# Patient Record
Sex: Male | Born: 1950 | Race: Black or African American | Hispanic: No | Marital: Married | Smoking: Current every day smoker
Health system: Southern US, Community
[De-identification: ages and names within clinical notes are randomized; demographics above are authoritative.]

## PROBLEM LIST (undated history)

## (undated) DIAGNOSIS — I10 Essential (primary) hypertension: Secondary | ICD-10-CM

## (undated) DIAGNOSIS — J449 Chronic obstructive pulmonary disease, unspecified: Secondary | ICD-10-CM

## (undated) HISTORY — DX: Chronic obstructive pulmonary disease, unspecified: J44.9

---

## 2011-09-21 ENCOUNTER — Encounter (HOSPITAL_COMMUNITY): Payer: Self-pay | Admitting: *Deleted

## 2011-09-21 ENCOUNTER — Emergency Department (HOSPITAL_COMMUNITY)
Admission: EM | Admit: 2011-09-21 | Discharge: 2011-09-21 | Disposition: A | Discharge status: Home / Self Care | Payer: Medicaid Other | Attending: Emergency Medicine | Admitting: Emergency Medicine

## 2011-09-21 DIAGNOSIS — H109 Unspecified conjunctivitis: Secondary | ICD-10-CM | POA: Insufficient documentation

## 2011-09-21 DIAGNOSIS — I10 Essential (primary) hypertension: Secondary | ICD-10-CM | POA: Insufficient documentation

## 2011-09-21 DIAGNOSIS — Z880 Allergy status to penicillin: Secondary | ICD-10-CM | POA: Insufficient documentation

## 2011-09-21 DIAGNOSIS — F17200 Nicotine dependence, unspecified, uncomplicated: Secondary | ICD-10-CM | POA: Insufficient documentation

## 2011-09-21 HISTORY — DX: Essential (primary) hypertension: I10

## 2011-09-21 MED ORDER — ERYTHROMYCIN 5 MG/GM OP OINT
TOPICAL_OINTMENT | Freq: Once | OPHTHALMIC | Status: DC
  Filled 2011-09-21: qty 3.5

## 2011-09-21 MED ORDER — TETRACAINE HCL 0.5 % OP SOLN
1.0000 [drp] | Freq: Once | OPHTHALMIC | Status: AC
  Administered 2011-09-21: 1 [drp] via OPHTHALMIC
  Filled 2011-09-21: qty 2

## 2011-09-21 MED ORDER — ERYTHROMYCIN 5 MG/GM OP OINT
TOPICAL_OINTMENT | Freq: Once | OPHTHALMIC | Status: AC
  Administered 2011-09-21: 11:00:00 via OPHTHALMIC

## 2011-09-21 MED ORDER — IBUPROFEN 800 MG PO TABS: 800.0000 mg | ORAL_TABLET | Freq: Three times a day (TID) | ORAL | Status: AC | PRN

## 2011-09-21 NOTE — ED Provider Notes (Signed)
History     CSN: [id number]  Arrival date & time 09/21/11  1023   First MD Initiated Contact with Patient 09/21/11 1031      Chief Complaint  Patient presents with  . Eye Pain    (Consider location/radiation/quality/duration/timing/severity/associated sxs/prior treatment) HPI Comments: Patient presents with a red right eye for the past 4 days. No acute injury or abrasions at onset. Patient denies nausea or vomiting. He denies change in vision. Patient states that he has had crusting in the eye in the morning. No sick contacts. History of vision problems. He does not wear contact lenses. He is taking tramadol without relief of pain. No history of autoimmune diseases. He does not have photophobia. Patient states that he has minimal pain in his eye and minimal pain with movement of his eye. Nothing makes symptoms better or worse. Onset was acute. Course is constant. Pain does not radiate.  Patient is a 61 y.o. male presenting with conjunctivitis. The history is provided by the patient.  Conjunctivitis  The current episode started 3 to 5 days ago. The onset was gradual. The problem has been unchanged. Associated symptoms include eye itching, eye discharge, eye pain and eye redness. Pertinent negatives include no fever, no decreased vision, no double vision, no photophobia, no nausea, no vomiting, no ear discharge, no ear pain, no headaches, no rhinorrhea and no sore throat. The eye pain is mild. There is pain in the right eye.    Past Medical History  Diagnosis Date  . Hypertension     History reviewed. No pertinent past surgical history.  No family history on file.  History  Substance Use Topics  . Smoking status: Current Everyday Smoker -- 1.0 packs/day  . Smokeless tobacco: Not on file  . Alcohol Use: Yes     ocassionally      Review of Systems  Constitutional: Negative for fever.  HENT: Negative for ear pain, sore throat, rhinorrhea and ear discharge.   Eyes: Positive for  pain, discharge, redness and itching. Negative for double vision, photophobia and visual disturbance.  Gastrointestinal: Negative for nausea and vomiting.  Neurological: Negative for headaches.    Allergies  Penicillins  Home Medications   Current Outpatient Rx  Name Route Sig Dispense Refill  . IBUPROFEN 800 MG PO TABS Oral Take 800 mg by mouth every 8 (eight) hours as needed. For pain.    Marland Kitchen LISINOPRIL-HYDROCHLOROTHIAZIDE 20-25 MG PO TABS Oral Take 1 tablet by mouth daily.    . TRAMADOL HCL 50 MG PO TABS Oral Take 50 mg by mouth every 8 (eight) hours as needed. For pain.      BP 150/113  Pulse 77  Temp 98.3 F (36.8 C) (Oral)  Resp 18  Wt 280 lb (127.007 kg)  SpO2 96%  Physical Exam  Nursing note and vitals reviewed. Constitutional: He appears well-developed and well-nourished.  HENT:  Head: Normocephalic and atraumatic.  Right Ear: External ear normal.  Left Ear: External ear normal.  Nose: Nose normal.  Mouth/Throat: Oropharynx is clear and moist.  Eyes: EOM are normal. Pupils are equal, round, and reactive to light. Right eye exhibits no discharge. Left eye exhibits no discharge. Right conjunctiva is injected. Right conjunctiva has no hemorrhage. Left conjunctiva is not injected. Left conjunctiva has no hemorrhage. Right eye exhibits normal extraocular motion. Left eye exhibits normal extraocular motion.       No pain with light shined in either eye. Pupils reactive. Right sclera and conjunctiva injected. Pressure, right: 30-35.  Cornea clear.   Neck: Normal range of motion. Neck supple.  Cardiovascular: Normal rate, regular rhythm and normal heart sounds.   Pulmonary/Chest: Effort normal and breath sounds normal.  Abdominal: Soft. There is no tenderness.  Neurological: He is alert.  Skin: Skin is warm and dry.  Psychiatric: He has a normal mood and affect.    ED Course  Procedures (including critical care time)  Labs Reviewed - No data to display No results  found.   1. Conjunctivitis     11:06 AM Patient seen and examined. Work-up initiated. Medications ordered. Visual acuity reviewed. Tonometry performed.   Vital signs reviewed and are as follows: Filed Vitals:   09/21/11 1037  BP: 150/113  Pulse: 77  Temp: 98.3 F (36.8 C)  Resp: 18    Patient counseled to use tobrex as follows: 0.5 inch ribbon in affected eye(s) 4 times a day.  Pt counseled on strategies to decrease transmission of infection. Pt counseled to see ophtho referral in 3 days to ensure resolution of symptoms, for IOC recheck given mild elevation in pressure.     MDM  No foreign bodies noted. No surrounding erythema, swelling, vision changes/loss suspicious for orbital or periorbital cellulitis. No signs of iritis. Do not suspect acute glaucoma given this presentation, however R eye pressures slightly elevated. Ophthalmology referral given for recheck.  No symptoms of retinal detachment. No ophthalmologic emergency suspected. Outpatient referral given for follow-up. Patient encouraged to follow-up.         Renne Crigler, [place] 09/21/11 1149

## 2011-09-21 NOTE — ED Notes (Signed)
Pt states "my eye started being like this on Monday, my eye hurt, my face and head around my eye"

## 2011-09-21 NOTE — ED Notes (Signed)
Pt presents with redness & edema to right eye.

## 2011-09-22 NOTE — ED Provider Notes (Signed)
Medical screening examination/treatment/procedure(s) were performed by non-physician practitioner and as supervising physician I was immediately available for consultation/collaboration.  Donnetta Hutching, MD 09/22/11 1112

## 2011-12-21 ENCOUNTER — Ambulatory Visit (INDEPENDENT_AMBULATORY_CARE_PROVIDER_SITE_OTHER): Payer: Medicaid Other | Admitting: General Surgery

## 2012-01-16 ENCOUNTER — Ambulatory Visit (INDEPENDENT_AMBULATORY_CARE_PROVIDER_SITE_OTHER): Payer: Medicaid Other | Admitting: General Surgery

## 2012-01-30 ENCOUNTER — Ambulatory Visit (INDEPENDENT_AMBULATORY_CARE_PROVIDER_SITE_OTHER): Payer: Medicaid Other | Admitting: General Surgery

## 2012-03-06 ENCOUNTER — Ambulatory Visit (INDEPENDENT_AMBULATORY_CARE_PROVIDER_SITE_OTHER): Payer: Medicaid Other | Admitting: General Surgery

## 2012-03-20 ENCOUNTER — Encounter (INDEPENDENT_AMBULATORY_CARE_PROVIDER_SITE_OTHER): Payer: Self-pay | Admitting: General Surgery

## 2012-03-20 ENCOUNTER — Ambulatory Visit (INDEPENDENT_AMBULATORY_CARE_PROVIDER_SITE_OTHER): Payer: Medicaid Other | Admitting: General Surgery

## 2012-03-20 VITALS — BP 138/88 | HR 80 | Temp 97.0°F | Resp 18 | Ht 71.0 in | Wt 307.0 lb

## 2012-03-20 DIAGNOSIS — L732 Hidradenitis suppurativa: Secondary | ICD-10-CM

## 2012-03-20 MED ORDER — SULFAMETHOXAZOLE-TRIMETHOPRIM 800-160 MG PO TABS: 1.0000 | ORAL_TABLET | Freq: Two times a day (BID) | ORAL | Status: AC

## 2012-03-20 NOTE — Patient Instructions (Signed)
Pick up antibiotic at your pharmacy  Hidradenitis Suppurativa, Sweat Gland Abscess Hidradenitis suppurativa is a long lasting (chronic), uncommon disease of the sweat glands. With this, boil-like lumps and scarring develop in the groin, some times under the arms (axillae), and under the breasts. It may also uncommonly occur behind the ears, in the crease of the buttocks, and around the genitals.  CAUSES  The cause is from a blocking of the sweat glands. They then become infected. It may cause drainage and odor. It is not contagious. So it cannot be given to someone else. It most often shows up in puberty (about 31 to 60 years of age). But it may happen much later. It is similar to acne which is a disease of the sweat glands. This condition is slightly more common in African-Americans and women. SYMPTOMS   Hidradenitis usually starts as one or more red, tender, swellings in the groin or under the arms (axilla).  Over a period of hours to days the lesions get larger. They often open to the skin surface, draining clear to yellow-colored fluid.  The infected area heals with scarring. DIAGNOSIS  Your caregiver makes this diagnosis by looking at you. Sometimes cultures (growing germs on plates in the lab) may be taken. This is to see what germ (bacterium) is causing the infection.  TREATMENT   Topical germ killing medicine applied to the skin (antibiotics) are the treatment of choice. Antibiotics taken by mouth (systemic) are sometimes needed when the condition is getting worse or is severe.  Avoid tight-fitting clothing which traps moisture in.  Dirt does not cause hidradenitis and it is not caused by poor hygiene.  Involved areas should be cleaned daily using an antibacterial soap. Some patients find that the liquid form of Lever 2000, applied to the involved areas as a lotion after bathing, can help reduce the odor related to this condition.  Sometimes surgery is needed to drain infected areas  or remove scarred tissue. Removal of large amounts of tissue is used only in severe cases.  Birth control pills may be helpful.  Oral retinoids (vitamin A derivatives) for 6 to 12 months which are effective for acne may also help this condition.  Weight loss will improve but not cure hidradenitis. It is made worse by being overweight. But the condition is not caused by being overweight.  This condition is more common in people who have had acne.  It may become worse under stress. There is no medical cure for hidradenitis. It can be controlled, but not cured. The condition usually continues for years with periods of getting worse and getting better (remission). Document Released: 09/27/2003 Document Revised: 05/07/2011 Document Reviewed: 10/13/2007 [facility] Patient Information 2013 [place], [place].

## 2012-03-20 NOTE — Progress Notes (Signed)
Patient ID: Ruben Anderson, male   DOB: 02-25-1951, 62 y.o.   MRN: [id number]  Chief Complaint  Patient presents with  . New Evaluation    cellulitis groin    HPI Ruben Anderson is a 62 y.o. male.   HPI 62 yo morbidly obese AAM referred by Ms Oran Rein, PA-C for evaluation of groin abscess. The patient was originally scheduled and referred back in October 2013 however the patient has canceled and rescheduled multiple times. The patient states that he has a boil in his pubic area. It will occasionally swell up and spontaneously drained. This happens every couple months. He also has an area in his inner thighs that does th same thing. He has never had these areas incised and drained. He has occasionally been placed on antibiotics for it. He states that he is trying to lose some weight. He denies any fevers or chills. Occasionally will drain some yellowish fluid. He denies any boils underneath this arm pits or underneath his pecs.  Past Medical History  Diagnosis Date  . Hypertension     No past surgical history on file.  Family History  Problem Relation Age of Onset  . Kidney disease Mother     Social History History  Substance Use Topics  . Smoking status: Current Every Day Smoker -- 1.0 packs/day  . Smokeless tobacco: Not on file  . Alcohol Use: Yes     Comment: ocassionally    Allergies  Allergen Reactions  . Penicillins Itching    Current Outpatient Prescriptions  Medication Sig Dispense Refill  . lisinopril-hydrochlorothiazide (PRINZIDE,ZESTORETIC) 20-25 MG per tablet Take 1 tablet by mouth daily.      . traMADol (ULTRAM) 50 MG tablet Take 50 mg by mouth every 8 (eight) hours as needed. For pain.      Marland Kitchen sulfamethoxazole-trimethoprim (BACTRIM DS) 800-160 MG per tablet Take 1 tablet by mouth 2 (two) times daily.  10 tablet  0    Review of Systems Review of Systems  Constitutional: Negative for fever, chills, appetite change and unexpected weight change.  HENT: Negative  for congestion and trouble swallowing.   Eyes: Negative for visual disturbance.  Respiratory: Negative for chest tightness and shortness of breath.   Cardiovascular: Negative for chest pain and leg swelling.       No PND, no orthopnea, + DOE  Gastrointestinal:       See HPI  Genitourinary: Negative for dysuria and hematuria.  Musculoskeletal: Negative.        OA pains  Skin: Negative for rash.  Neurological: Negative for seizures and speech difficulty.  Hematological: Does not bruise/bleed easily.  Psychiatric/Behavioral: Negative for behavioral problems and confusion.    Blood pressure 138/88, pulse 80, temperature 97 F (36.1 C), resp. rate 18, height 5\' 11"  (1.803 m), weight 307 lb (139.254 kg).  Physical Exam Physical Exam  Vitals reviewed. Constitutional: He is oriented to person, place, and time. He appears well-developed and well-nourished. No distress.       Morbid obesity  HENT:  Head: Normocephalic and atraumatic.  Right Ear: External ear normal.  Left Ear: External ear normal.  Eyes: Conjunctivae normal are normal. No scleral icterus.  Neck: No tracheal deviation present. No thyromegaly present.  Cardiovascular: Normal rate, regular rhythm and normal heart sounds.   Pulmonary/Chest: Effort normal and breath sounds normal. No respiratory distress. He has no wheezes.  Abdominal: Soft. He exhibits no distension. There is no tenderness.  Genitourinary: Penis normal.  Decent size pannus. In L suprapubic has pinpoint area draining yellowish fluid. About 1 cm of surrounding induration, no cellulitis, no fluctuance. Underneath pannus in base pannicular fold has a few pinpoint openings that will drain some thin yellow fluid, no cellulitis, no fluctuance, no induration.   Musculoskeletal: He exhibits no edema.  Lymphadenopathy:    He has no cervical adenopathy.  Neurological: He is alert and oriented to person, place, and time. He exhibits normal muscle tone.  Skin:  Skin is warm and dry. He is not diaphoretic.  Psychiatric: He has a normal mood and affect. His behavior is normal. Judgment and thought content normal.    Data Reviewed Ms Oran Rein, PA-C note from 11/2011  Assessment    Hidradenitis of pubis    Plan    He does have a little bit of induration around an area in his suprapubic area. However it is not significantly infected enough for me to recommend incision and drainage in the office today. I will place him on Bactrim for 5 days. We discussed hydradenitis. He was given Agricultural engineer. My recommendation was to treat it as he develops problems with it. We discussed surgical excision of the skin and subcutaneous tissues as a way for definitive treatment. We discussed the risk and benefits of that procedure. However given his morbid obesity and tobacco use I explained that he has a significant chance of wound healing problems. Since this is not really bother him that much I recommend waiting on surgery until he loses additional weight and stop smoking. Followup as needed  Mary Sella. Andrey Campanile, MD, FACS General, Bariatric, & Minimally Invasive Surgery [facility], [place]        [facility] M 03/20/2012, 11:07 AM

## 2015-04-15 ENCOUNTER — Emergency Department (HOSPITAL_COMMUNITY)
Admission: EM | Admit: 2015-04-15 | Discharge: 2015-04-15 | Disposition: A | Discharge status: Home / Self Care | Payer: Medicaid Other | Attending: Emergency Medicine | Admitting: Emergency Medicine

## 2015-04-15 ENCOUNTER — Encounter (HOSPITAL_COMMUNITY): Payer: Self-pay | Admitting: Family Medicine

## 2015-04-15 DIAGNOSIS — G510 Bell's palsy: Secondary | ICD-10-CM

## 2015-04-15 DIAGNOSIS — I10 Essential (primary) hypertension: Secondary | ICD-10-CM | POA: Diagnosis not present

## 2015-04-15 DIAGNOSIS — M25511 Pain in right shoulder: Secondary | ICD-10-CM | POA: Insufficient documentation

## 2015-04-15 DIAGNOSIS — Z880 Allergy status to penicillin: Secondary | ICD-10-CM | POA: Diagnosis not present

## 2015-04-15 DIAGNOSIS — H9191 Unspecified hearing loss, right ear: Secondary | ICD-10-CM | POA: Diagnosis not present

## 2015-04-15 DIAGNOSIS — R29810 Facial weakness: Secondary | ICD-10-CM | POA: Diagnosis present

## 2015-04-15 DIAGNOSIS — F17200 Nicotine dependence, unspecified, uncomplicated: Secondary | ICD-10-CM | POA: Diagnosis not present

## 2015-04-15 DIAGNOSIS — Z79899 Other long term (current) drug therapy: Secondary | ICD-10-CM | POA: Insufficient documentation

## 2015-04-15 MED ORDER — PREDNISONE 10 MG PO TABS: ORAL_TABLET | ORAL | Status: DC

## 2015-04-15 NOTE — Discharge Instructions (Signed)
Bell Palsy °Bell palsy is a condition in which the muscles on one side of the face become paralyzed. This often causes one side of the face to droop. It is a common condition and most people recover completely. °RISK FACTORS °Risk factors for Bell palsy include: °· Pregnancy. °· Diabetes. °· An infection by a virus, such as infections that cause cold sores. °CAUSES  °Bell palsy is caused by damage to or inflammation of a nerve in your face. It is unclear why this happens, but an infection by a virus may lead to it. Most of the time the reason it happens is unknown. °SIGNS AND SYMPTOMS  °Symptoms can range from mild to severe and can take place over a number of hours. Symptoms may include: °· Being unable to: °¨ Raise one or both eyebrows. °¨ Close one or both eyes. °¨ Feel parts of your face (facial numbness). °· Drooping of the eyelid and corner of the mouth. °· Weakness in the face. °· Paralysis of half your face. °· Loss of taste. °· Sensitivity to loud noises. °· Difficulty chewing. °· Tearing up of the affected eye. °· Dryness in the affected eye. °· Drooling. °· Pain behind one ear. °DIAGNOSIS  °Diagnosis of Bell palsy may include: °· A medical history and physical exam. °· An MRI. °· A CT scan. °· Electromyography (EMG). This is a test that checks how your nerves are working. °TREATMENT  °Treatment may include antiviral medicine to help shorten the length of the condition. Sometimes treatment is not needed and the symptoms go away on their own. °HOME CARE INSTRUCTIONS  °· Take medicines only as directed by your health care provider. °· Do facial massages and exercises as directed by your health care provider. °· If your eye is affected: °¨ Use moisturizing eye drops to prevent drying of your eye as directed by your health care provider. °¨ Protect your eye as directed by your health care provider. °SEEK MEDICAL CARE IF: °· Your symptoms do not get better or get worse. °· You are drooling. °· Your eye is red,  irritated, or hurts. °SEEK IMMEDIATE MEDICAL CARE IF:  °· Another part of your body feels weak or numb. °· You have difficulty swallowing. °· You have a fever along with symptoms of Bell palsy. °· You develop neck pain. °MAKE SURE YOU:  °· Understand these instructions. °· Will watch your condition. °· Will get help right away if you are not doing well or get worse. °  °This information is not intended to replace advice given to you by your health care provider. Make sure you discuss any questions you have with your health care provider. °  °Document Released: 02/12/2005 Document Revised: 11/03/2014 Document Reviewed: 05/22/2013 °Elsevier Interactive Patient Education ©2016 [employer]vier Inc[employer] ° °

## 2015-04-15 NOTE — ED Provider Notes (Signed)
CSN: [id number]     Arrival date & time 04/15/15  1607 History  By signing my name below, I, [facility], attest that this documentation has been prepared under the direction and in the presence of [employer], PA-C. Electronically Signed: Randell Patient, ED Scribe. 04/15/2015. 5:52 PM.   Chief Complaint  Patient presents with  . Facial Droop   The history is provided by the patient. No language interpreter was used.   HPI Comments: Ruben Anderson is a 65 y.o. male with an hx of HTN who presents to the Emergency Department complaining of constant, mild, unchanging right-sided facial droop upon waking this morning. Patient reports that other than congestion for the past few days he was healthy when going to sleep last night before waking this morning with the facial droop and tearing from his right eye. He endorses associated right-sided shoulder pain and this caused by sleeping with this arm propped up.Marland Kitchen He has taken Mucinex for treatment of his congestion.  Per patient, he has an appointment with his PCP 3 days from today for a chek-up. Current smoker but states that he plans to quit next week. Patient denies an hx of DM. He denies headache, dizziness, nausea, vomiting, memory deficits, weakness in his extremities, and paraesthesia.   Past Medical History  Diagnosis Date  . Hypertension    History reviewed. No pertinent past surgical history. Family History  Problem Relation Age of Onset  . Kidney disease Mother    Social History  Substance Use Topics  . Smoking status: Current Every Day Smoker -- 1.00 packs/day  . Smokeless tobacco: None  . Alcohol Use: Yes     Comment: ocassionally    Review of Systems A complete 10 system review of systems was obtained and all systems are negative except as noted in the HPI and PMH.    Allergies  Penicillins  Home Medications   Prior to Admission medications   Medication Sig Start Date End Date Taking? Authorizing Provider   lisinopril-hydrochlorothiazide (PRINZIDE,ZESTORETIC) 20-25 MG per tablet Take 1 tablet by mouth daily.    Historical Provider, MD  predniSONE (DELTASONE) 10 MG tablet Please take 6 tabs daily for 5 days. Day six 5 tabs, Day seven 4 tabs, Day eight 3 tabs, Day nine 2 tabs, Day ten 1 tab 04/15/15   Eyvonne Mechanic, PA-C  traMADol (ULTRAM) 50 MG tablet Take 50 mg by mouth every 8 (eight) hours as needed. For pain.    Historical Provider, MD   BP 121/82 mmHg  Pulse 93  Temp(Src) 98.4 F (36.9 C) (Oral)  Resp 18  SpO2 94%   Physical Exam  Constitutional: He is oriented to person, place, and time. He appears well-developed and well-nourished. No distress.  HENT:  Head: Normocephalic and atraumatic.  Right Ear: Tympanic membrane and ear canal normal. Decreased hearing is noted.  Left Ear: Hearing, external ear and ear canal normal.  Eyes: Conjunctivae and EOM are normal.  Neck: Neck supple. No tracheal deviation present.  Cardiovascular: Normal rate.   Pulmonary/Chest: Effort normal. No respiratory distress.  Musculoskeletal: Normal range of motion.  Neurological: He is alert and oriented to person, place, and time. A cranial nerve deficit is present.  Patient unable to raise right eyebrow and wrinkle forehead. Unable to completely close right eye and drooping of the right corner mouth. Remainder of strength exam completely normal, sensation grossly intact to upper and lower extremities  Skin: Skin is warm and dry.  Psychiatric: He has a normal  mood and affect. His behavior is normal.  Nursing note and vitals reviewed.   ED Course  Procedures   DIAGNOSTIC STUDIES: Oxygen Saturation is 94% on RA, adequate by my interpretation.    COORDINATION OF CARE: 5:29 PM Discussed treatment plan with pt at bedside and pt agreed to plan.  Labs Review Labs Reviewed - No data to display  Imaging Review No results found. I have personally reviewed and evaluated these images and lab results as part  of my medical decision-making.   EKG Interpretation None      MDM   Final diagnoses:  Bell's palsy    Labs:  Imaging:  Consults:  Therapeutics:  Discharge Meds: Steroids  Assessment/Plan: 65 year old male presents today with right-sided facial droop. His signs and symptoms are most consistent with Bell's palsy. Patient has focal neurological deficits of the right side of the face, he has no other neurological deficits on exam. Patient is well-appearing in no acute distress, he has no headache or dizziness. Patient reports that he is not diabetic, not on any blood thinners, he'll be started on steroids, he has a previous scheduled follow-up with his primary care provider on Monday for routine physical. He is instructed to make this appointment, he is encouraged to return immediately to the emergency room if any new or worsening signs or symptoms present. Patient verbalized understanding and agreement for today's plan had no further questions or concerns at time of discharge  I personally performed the services described in this documentation, which was scribed in my presence. The recorded information has been reviewed and is accurate.    Eyvonne Mechanic, PA-C 04/15/15 1752  Eyvonne Mechanic, PA-C 04/15/15 1752  Raeford Razor, MD 04/16/15 [id number]

## 2015-04-15 NOTE — ED Notes (Signed)
Pt states c/o facial numbness to his right side that he first noticed this morning when he woke up, facial droop noted

## 2015-04-15 NOTE — ED Notes (Signed)
Pt here for right sided facial droop. sts he woke up this way. No other deficits noted.  sts he has been sick with a cold and having shoulder and neck pain the past 3 days.

## 2017-10-17 ENCOUNTER — Other Ambulatory Visit: Payer: Self-pay | Admitting: Family Medicine

## 2017-10-17 ENCOUNTER — Ambulatory Visit
Admission: RE | Admit: 2017-10-17 | Discharge: 2017-10-17 | Disposition: A | Discharge status: Home / Self Care | Payer: Self-pay | Source: Ambulatory Visit | Attending: Family Medicine | Admitting: Family Medicine

## 2017-10-17 DIAGNOSIS — R053 Chronic cough: Secondary | ICD-10-CM

## 2017-10-17 DIAGNOSIS — F17200 Nicotine dependence, unspecified, uncomplicated: Secondary | ICD-10-CM

## 2017-10-17 DIAGNOSIS — R05 Cough: Secondary | ICD-10-CM

## 2018-07-16 ENCOUNTER — Other Ambulatory Visit: Payer: Self-pay | Admitting: Family Medicine

## 2018-07-16 DIAGNOSIS — R634 Abnormal weight loss: Secondary | ICD-10-CM

## 2018-07-16 DIAGNOSIS — R0602 Shortness of breath: Secondary | ICD-10-CM

## 2018-07-16 DIAGNOSIS — R5383 Other fatigue: Secondary | ICD-10-CM

## 2018-07-16 DIAGNOSIS — R1084 Generalized abdominal pain: Secondary | ICD-10-CM

## 2018-07-31 ENCOUNTER — Other Ambulatory Visit: Payer: Self-pay | Admitting: Family Medicine

## 2018-07-31 DIAGNOSIS — M25511 Pain in right shoulder: Secondary | ICD-10-CM

## 2018-08-05 ENCOUNTER — Other Ambulatory Visit: Payer: Self-pay

## 2018-08-05 ENCOUNTER — Ambulatory Visit
Admission: RE | Admit: 2018-08-05 | Discharge: 2018-08-05 | Disposition: A | Discharge status: Home / Self Care | Payer: Medicare HMO | Source: Ambulatory Visit | Attending: Family Medicine | Admitting: Family Medicine

## 2018-08-05 ENCOUNTER — Encounter: Payer: Self-pay | Admitting: Radiology

## 2018-08-05 DIAGNOSIS — M25511 Pain in right shoulder: Secondary | ICD-10-CM

## 2018-08-05 DIAGNOSIS — R634 Abnormal weight loss: Secondary | ICD-10-CM

## 2018-08-05 DIAGNOSIS — R1084 Generalized abdominal pain: Secondary | ICD-10-CM

## 2018-08-05 DIAGNOSIS — R0602 Shortness of breath: Secondary | ICD-10-CM

## 2018-08-05 DIAGNOSIS — R5383 Other fatigue: Secondary | ICD-10-CM

## 2018-08-05 MED ORDER — IOPAMIDOL (ISOVUE-300) INJECTION 61%
125.0000 mL | Freq: Once | INTRAVENOUS | Status: AC | PRN
  Administered 2018-08-05: 100 mL via INTRAVENOUS

## 2018-09-02 ENCOUNTER — Ambulatory Visit (AMBULATORY_SURGERY_CENTER): Payer: Medicare HMO | Admitting: *Deleted

## 2018-09-02 ENCOUNTER — Other Ambulatory Visit: Payer: Self-pay

## 2018-09-02 VITALS — Ht 71.5 in | Wt 170.0 lb

## 2018-09-02 DIAGNOSIS — Z1211 Encounter for screening for malignant neoplasm of colon: Secondary | ICD-10-CM

## 2018-09-02 MED ORDER — SUPREP BOWEL PREP KIT 17.5-3.13-1.6 GM/177ML PO SOLN: 1.0000 | Freq: Once | ORAL | 0 refills | Status: AC

## 2018-09-02 NOTE — Progress Notes (Signed)
Patient denies any allergies to egg or soy products. Patient denies complications with anesthesia/sedation.  Patient denies oxygen use at home and denies diet medications.   Pt verified name, DOB, address and insurance during PV today. Pt mailed instruction packet to included paper to complete and mail back to [facility] with addressed and stamped envelope, Emmi video, copy of consent form to read and not return, and instructions.  PV completed over the phone. Pt encouraged to call with questions or issues.   

## 2018-09-15 ENCOUNTER — Telehealth: Payer: Self-pay | Admitting: Gastroenterology

## 2018-09-15 NOTE — Telephone Encounter (Signed)

## 2018-09-16 ENCOUNTER — Encounter: Payer: Medicare HMO | Admitting: Gastroenterology

## 2018-09-16 ENCOUNTER — Telehealth: Payer: Self-pay | Admitting: Gastroenterology

## 2018-09-16 NOTE — Telephone Encounter (Signed)
Patient canceled procedure at 1:00pm. Spoke with patient he stated that he woke up with a strong fever (102) and do not feel well he will need to reschedule at a later time.

## 2018-11-12 ENCOUNTER — Encounter: Payer: Self-pay | Admitting: Gastroenterology

## 2018-11-13 ENCOUNTER — Institutional Professional Consult (permissible substitution): Payer: Medicare HMO | Admitting: Internal Medicine

## 2018-11-14 ENCOUNTER — Telehealth: Payer: Self-pay | Admitting: Internal Medicine

## 2018-11-14 NOTE — Telephone Encounter (Signed)
Attempted to call patient, no answer left a detailed message advising if he has any COVID symptoms not to come in the office.  In message asked patient to call Monday morning prior to appointment.

## 2018-11-17 ENCOUNTER — Institutional Professional Consult (permissible substitution): Payer: Medicare HMO | Admitting: Internal Medicine

## 2018-11-17 NOTE — Telephone Encounter (Signed)
Pt has not shown up for 11:00 consult with MW today (11:26 as I am documenting this).  lmtcb for pt, as this will need to be rescheduled.  Wcb.

## 2018-11-18 NOTE — Telephone Encounter (Signed)
Spoke with pt, he scheduled a televisit for Monday at 11:15 with Dr. Melvyn Novas. Nothing further is needed.

## 2018-11-18 NOTE — Telephone Encounter (Signed)
ATC pt, no answer. Left message for pt to call back.  

## 2018-11-24 ENCOUNTER — Ambulatory Visit: Payer: Medicare HMO | Admitting: Internal Medicine

## 2018-11-24 ENCOUNTER — Other Ambulatory Visit: Payer: Self-pay

## 2018-12-10 ENCOUNTER — Ambulatory Visit: Payer: Medicare HMO | Admitting: Internal Medicine

## 2018-12-22 ENCOUNTER — Encounter: Payer: Self-pay | Admitting: Internal Medicine

## 2018-12-22 ENCOUNTER — Ambulatory Visit (INDEPENDENT_AMBULATORY_CARE_PROVIDER_SITE_OTHER): Payer: Medicare HMO | Admitting: Internal Medicine

## 2018-12-22 ENCOUNTER — Other Ambulatory Visit: Payer: Self-pay

## 2018-12-22 ENCOUNTER — Ambulatory Visit (INDEPENDENT_AMBULATORY_CARE_PROVIDER_SITE_OTHER)
Admission: RE | Admit: 2018-12-22 | Discharge: 2018-12-22 | Disposition: A | Discharge status: Home / Self Care | Payer: Medicare HMO | Source: Ambulatory Visit | Attending: Internal Medicine | Admitting: Internal Medicine

## 2018-12-22 DIAGNOSIS — J449 Chronic obstructive pulmonary disease, unspecified: Secondary | ICD-10-CM | POA: Diagnosis not present

## 2018-12-22 DIAGNOSIS — I10 Essential (primary) hypertension: Secondary | ICD-10-CM | POA: Diagnosis not present

## 2018-12-22 DIAGNOSIS — F17210 Nicotine dependence, cigarettes, uncomplicated: Secondary | ICD-10-CM | POA: Diagnosis not present

## 2018-12-22 DIAGNOSIS — R9389 Abnormal findings on diagnostic imaging of other specified body structures: Secondary | ICD-10-CM

## 2018-12-22 MED ORDER — TELMISARTAN-HCTZ 80-25 MG PO TABS: 1.0000 | ORAL_TABLET | Freq: Every day | ORAL | 11 refills | Status: DC

## 2018-12-22 MED ORDER — BREO ELLIPTA 100-25 MCG/INH IN AEPB: 1.0000 | INHALATION_SPRAY | Freq: Every morning | RESPIRATORY_TRACT | 11 refills | Status: DC

## 2018-12-22 MED ORDER — BUDESONIDE-FORMOTEROL FUMARATE 160-4.5 MCG/ACT IN AERO: 2.0000 | INHALATION_SPRAY | Freq: Two times a day (BID) | RESPIRATORY_TRACT | 0 refills | Status: DC

## 2018-12-22 NOTE — Patient Instructions (Addendum)
The key is to stop smoking completely before smoking completely stops you!  Start BREO  887  One click each am  - take a deep drag or two to be sure you get it all in then rinse and gargle and spit  Stop zestoretic (linsinopril)  And start micardis  80-25 one daily  - call if any problem getting this substitution   Please remember to go to the  x-ray department  for your tests - we will call you with the results when they are available     Please schedule a follow up office visit in 4 weeks, sooner if needed  with all medications /inhalers/ solutions in hand so we can verify exactly what you are taking. This includes all medications from all doctors and over the counters

## 2018-12-22 NOTE — Progress Notes (Addendum)
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 Pulmonary Consult    Referred by Dr. Lin Landsman. Pt c/o SOB for the past 1-2 years. He gets SOB walking from room to room at home.   Dyspnea:  60 f- 75 ft and stops Cough: slt hoarse /dry daytime cough  Sleep: pillows x one or two s resp symptoms SABA use: none  ? Some better with symbicort but not hfa near 0% effective even with me triggering device for him    No obvious day to day or daytime variability or assoc excess/ purulent sputum or mucus plugs or hemoptysis or cp or chest tightness, subjective wheeze or overt sinus or hb symptoms.   Sleeping without nocturnal  or early am exacerbation  of respiratory  c/o's or need for noct saba. Also denies any obvious fluctuation of symptoms with weather or environmental changes or other aggravating or alleviating factors except as outlined above   No unusual exposure hx or h/o childhood pna  or knowledge of premature birth.  Current Allergies, Complete Past Medical History, Past Surgical History, Family History, and Social History were reviewed in [facility] record.  ROS  The following are not active complaints unless bolded Hoarseness, sore throat, dysphagia, dental problems, itching, sneezing,  nasal congestion or discharge of excess mucus or purulent secretions, ear ache,   fever, chills, sweats, unintended wt loss or wt gain, classically pleuritic or exertional cp,   orthopnea pnd or arm/hand swelling  or leg swelling, presyncope, palpitations, abdominal pain, anorexia, nausea, vomiting, diarrhea  or change in bowel habits or change in bladder habits, change in stools or change in urine, dysuria, hematuria,  rash, arthralgias, visual complaints, headache, numbness, weakness or ataxia or problems with walking or coordination,  change in mood or  memory.          Past Medical History:  Diagnosis Date  . COPD (chronic obstructive pulmonary disease) ([place])   . Hypertension     Outpatient Medications Prior to Visit  Medication Sig Dispense Refill  . lisinopril-hydrochlorothiazide (PRINZIDE,ZESTORETIC) 20-25 MG per tablet Take 1 tablet by mouth daily.    Marland Kitchen oxyCODONE-acetaminophen (PERCOCET) 10-325 MG tablet     . SYMBICORT 160-4.5 MCG/ACT inhaler Inhale 2 puffs into the lungs 2 (two) times daily.     . traMADol (ULTRAM) 50 MG tablet Take 50 mg by mouth every 8 (eight) hours as needed. For pain.    Marland Kitchen SUPREP BOWEL PREP KIT 17.5-3.13-1.6 GM/177ML SOLN         Objective:     BP 130/82 (BP Location: Left Arm, Cuff Size: Normal)   Temp (!) 97.2 F (36.2 C) (Temporal)   Ht '5\' 11"'  (1.803 m)   Wt 155 lb (70.3 kg)   SpO2 91% Comment: on RA  BMI 21.62 kg/m   SpO2: 91 %(on RA)   W/c bound >>> stated age chronically appearing thin bm can't  more either arm at shoulder  HEENT : pt wearing mask not removed for exam due to covid -19 concerns.    NECK :  without JVD/Nodes/TM/ nl carotid upstrokes bilaterally   LUNGS: no acc muscle use,  Mod barrel  contour chest wall with bilateral  Distant bs s audible wheeze and  without cough on insp or exp maneuvers and mod  Hyperresonant  to  percussion bilaterally     CV:  RRR  no s3 or murmur or increase in P2, and no edema   ABD:  soft and nontender with pos mid insp Hoover's  in the supine position. No bruits or organomegaly appreciated, bowel sounds nl  MS:     ext warm with severe degen deformities both hands ,  calf tenderness, cyanosis or clubbing  - bilateral frozen shoulders to has to bend over at waist to use inhalers     SKIN: warm and dry without lesions    NEURO:  ? Slowed mentation  with  no motor or cerebellar deficits apparent.          I personally reviewed images and agree with radiology impression as follows:   Chest CT  08/05/2018 1.  Severe, bullous emphysema.  2. There are dense, bandlike consolidations or scarring of the lateral segment right middle lobe and right lung base, likely sequelae of prior infection or aspiration. There is no evidence of suspicious pulmonary nodule, mass, or other evidence of lung malignancy.  3. Severe prostatomegaly with multiple large low-attenuation internal nodules, the prostate measuring at least 7.7 cm in diameter. There is no evidence of pelvic or abdominal lymphadenopathy, or CT findings suspicious for osseous metastatic disease.  4.  Atherosclerosis.   CXR PA and Lateral:   12/22/2018 :    I personally reviewed images and agree with radiology impression as follows:    Emphysema and further collapse of the right middle lobe. Endobronchial debris and/or obstructing endobronchial lesion cannot be excluded and correlation with either a repeat contrast chest CT or bronchoscopy may be considered.         Assessment   COPD GOLD ?  active smoker Active smoker -  12/22/2018  After extensive coaching inhaler device,  effectiveness =   75% with dpi p loaded  But 0 with hfa even with assistance > try Breo 1 click each am   DDX of  difficult airways management almost all start with A and  include Adherence, Ace Inhibitors, Acid Reflux, Active Sinus Disease, Alpha 1 Antitripsin deficiency, Anxiety masquerading as Airways dz,  ABPA,  Allergy(esp in young), Aspiration (esp in elderly), Adverse effects of meds,  Active smoking or vaping, A bunch of PE's (a small clot burden can't cause this syndrome unless there is already severe underlying  pulm or vascular dz with poor reserve) plus two Bs  = Bronchiectasis and Beta blocker use..and one C= CHF  Adherence is always the initial "prime suspect" and is a multilayered concern that requires a "trust but verify" approach in every patient - starting with knowing how to use medications, especially inhalers, correctly, keeping up with refills and understanding the fundamental difference between maintenance and prns vs those medications only taken for a very short course and then stopped and not refilled.  - see device teaching - return with all meds in hand using a trust but verify approach to confirm accurate Medication  Reconciliation The principal here is that until we are certain that the  patients are doing what we've asked,  it makes no sense to ask them to do more.   Active smoking > see sep a/p  ACEi adverse effects also  at the  top of the usual list of suspects and the only way to rule it out is a trial off > see a/p    ?allergy/ AB >  rx just with laba/ics for now given severity of protate enlargement would avoid LAMA   ? Anxiety/depression/ cognitive decline with deconditioning > usually at the bottom of this list of usual suspects but   may interfere with adherence and also interpretation of response or lack thereof to symptom management which can be quite subjective.   ? 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  Discussed in detail all the  indications, usual  risks and alternatives  relative to the benefits with patient who agrees to proceed with w/u as outlined.       Essential hypertension Dc acei 12/22/2018   In the best review of chronic  cough to date ( NEJM 2016 375 2595-6387) ,  ACEi are now felt to cause cough in up to  20% of pts which is a 4 fold increase from previous reports and does not include the variety of non-specific complaints we see in pulmonary clinic in pts on ACEi but previously attributed to another dx like  Copd/asthma and  include PNDS, throat and chest congestion, "bronchitis", unexplained dyspnea and noct "strangling" sensations, and hoarseness, but also  atypical /refractory GERD symptoms like dysphagia and "bad heartburn"   The only way I know  to prove this is not an "ACEi Case" is a trial off ACEi x a minimum of 4 weeks then regroup.   Try micardis 80-25 instead   Cigarette smoker 4-5 min discussion re active cigarette smoking in addition to office E&M  Ask about tobacco use:   ongoing Advise quitting   I emphasized that although we never turn away smokers from the pulmonary clinic, we do ask that they understand that the recommendations that we make  won't work nearly as well in the presence of continued cigarette exposure. In fact, we may very well  reach a point where we can't promise to help the patient if he/she can't quit smoking. (We can and will promise to try to help, we just can't promise what we recommend will really work)  Assess willingness:  Not committed at this point Assist in quit attempt:  Per PCP when ready Arrange follow up:   Follow up per Primary Care planned           Total time devoted to counseling  > 50 % of initial 60 min office visit:  reviewed case with pt/by speaker phone with son Ronalee Belts,  performed device teaching  using a teach back technique which also  extended face to face time for this visit (see above) discussion of options/alternatives/ personally creating written customized instructions  in presence of pt  then going over those specific  Instructions directly with the pt including how to use all of the meds but in particular covering each new medication  in detail and  the difference between the maintenance= "automatic" meds and the prns using an action plan format for the latter (If this problem/symptom => do that organization reading Left to right).  Please see AVS from this visit for a full list of these instructions which I personally wrote for this pt and  are unique to this visit.      Christinia Gully, MD 12/22/2018

## 2018-12-23 ENCOUNTER — Encounter: Payer: Self-pay | Admitting: Internal Medicine

## 2018-12-23 ENCOUNTER — Encounter: Payer: Medicare HMO | Admitting: Gastroenterology

## 2018-12-23 ENCOUNTER — Telehealth: Payer: Self-pay | Admitting: Internal Medicine

## 2018-12-23 DIAGNOSIS — F17210 Nicotine dependence, cigarettes, uncomplicated: Secondary | ICD-10-CM | POA: Insufficient documentation

## 2018-12-23 DIAGNOSIS — R9389 Abnormal findings on diagnostic imaging of other specified body structures: Secondary | ICD-10-CM | POA: Insufficient documentation

## 2018-12-23 DIAGNOSIS — R918 Other nonspecific abnormal finding of lung field: Secondary | ICD-10-CM

## 2018-12-23 NOTE — Assessment & Plan Note (Signed)
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  Discussed in detail all the  indications, usual  risks and alternatives  relative to the benefits with patient who agrees to proceed with w/u as outlined.

## 2018-12-23 NOTE — Assessment & Plan Note (Signed)
Dc acei 12/22/2018   In the best review of chronic cough to date ( NEJM 2016 375 8144-8185) ,  ACEi are now felt to cause cough in up to  20% of pts which is a 4 fold increase from previous reports and does not include the variety of non-specific complaints we see in pulmonary clinic in pts on ACEi but previously attributed to another dx like  Copd/asthma and  include PNDS, throat and chest congestion, "bronchitis", unexplained dyspnea and noct "strangling" sensations, and hoarseness, but also  atypical /refractory GERD symptoms like dysphagia and "bad heartburn"   The only way I know  to prove this is not an "ACEi Case" is a trial off ACEi x a minimum of 4 weeks then regroup.   Try micardis 80-25 instead   Total time devoted to counseling  > 50 % of initial 60 min office visit:  reviewed case with pt/by speaker phone with son Ronalee Belts,  performed device teaching  using a teach back technique which also  extended face to face time for this visit (see above) discussion of options/alternatives/ personally creating written customized instructions  in presence of pt  then going over those specific  Instructions directly with the pt including how to use all of the meds but in particular covering each new medication in detail and the difference between the maintenance= "automatic" meds and the prns using an action plan format for the latter (If this problem/symptom => do that organization reading Left to right).  Please see AVS from this visit for a full list of these instructions which I personally wrote for this pt and  are unique to this visit.

## 2018-12-23 NOTE — Assessment & Plan Note (Signed)

## 2018-12-23 NOTE — Progress Notes (Signed)
LMTCB

## 2018-12-23 NOTE — Assessment & Plan Note (Signed)
Active smoker -  12/22/2018  After extensive coaching inhaler device,  effectiveness =   75% with dpi p loaded  But 0 with hfa even with assistance > try Breo 1 click each am   DDX of  difficult airways management almost all start with A and  include Adherence, Ace Inhibitors, Acid Reflux, Active Sinus Disease, Alpha 1 Antitripsin deficiency, Anxiety masquerading as Airways dz,  ABPA,  Allergy(esp in young), Aspiration (esp in elderly), Adverse effects of meds,  Active smoking or vaping, A bunch of PE's (a small clot burden can't cause this syndrome unless there is already severe underlying pulm or vascular dz with poor reserve) plus two Bs  = Bronchiectasis and Beta blocker use..and one C= CHF  Adherence is always the initial "prime suspect" and is a multilayered concern that requires a "trust but verify" approach in every patient - starting with knowing how to use medications, especially inhalers, correctly, keeping up with refills and understanding the fundamental difference between maintenance and prns vs those medications only taken for a very short course and then stopped and not refilled.  - see device teaching - return with all meds in hand using a trust but verify approach to confirm accurate Medication  Reconciliation The principal here is that until we are certain that the  patients are doing what we've asked, it makes no sense to ask them to do more.   Active smoking > see sep a/p  ACEi adverse effects also  at the  top of the usual list of suspects and the only way to rule it out is a trial off > see a/p    ?allergy/ AB >  rx just with laba/ics for now given severity of protate enlargement would avoid LAMA   ? Anxiety/depression/ cognitive decline with deconditioning > usually at the bottom of this list of usual suspects but   may interfere with adherence and also interpretation of response or lack thereof to symptom management which can be quite subjective.   ? Alpha one AT > extremely  rare in AA  >>> will keep things simple for now and just make two changes for now then see back in 4 weeks

## 2018-12-23 NOTE — Telephone Encounter (Signed)
Called the patient and advised of the result information below. Patient asked if it will be done within 4 weeks. He is already scheduled for a follow up with Dr. Melvyn Novas on 01/21/19.  Chest CT ordered with notation to be done prior to already scheduled appointment with Dr. Melvyn Novas.  Nothing further needed at this time.

## 2018-12-23 NOTE — Telephone Encounter (Signed)
Pt returning call.  [phone].

## 2018-12-23 NOTE — Telephone Encounter (Signed)
Attempted to call patient, no answer, left message to call back.  It appears that results were attempted to be called earlier today.  See below results.  "Notes recorded by Tanda Rockers, MD on 12/22/2018 at 5:09 PM EDT  Call pt: Reviewed cxr and there is collapse of one his smaller lobes on the right so rec CT c contrast No rush"

## 2018-12-25 ENCOUNTER — Telehealth: Payer: Self-pay

## 2018-12-25 NOTE — Telephone Encounter (Signed)
Started a PA on Telmisartan 80-25mg . Will await response from insurance.   [place]: [id number] - PA Case ID: [id number] Need help? Call us at [phone] Status Sent to [place] 80-25MG  tablets Form [employer] Electronic PA For

## 2018-12-26 ENCOUNTER — Telehealth: Payer: Self-pay

## 2018-12-26 NOTE — Telephone Encounter (Signed)
Patient No Showed for PV today. Patient was called to reschedule. No answer. A message was left on voicemail to call and reschedule before 5:00 Pm today. Patient was informed that if he did not reschedule before 5:00 his procedure will be cancelled. A no show letter will be mailed at the end of the day.   Riki Sheer, LPN ( PV )

## 2018-12-30 MED ORDER — OLMESARTAN MEDOXOMIL-HCTZ 20-12.5 MG PO TABS: 1.0000 | ORAL_TABLET | Freq: Every day | ORAL | 5 refills | Status: AC

## 2018-12-30 NOTE — Telephone Encounter (Signed)
Received fax from [facility]. PA for telmisartan-hctz was denied. Patient must try and fail two of the following medications: lisinopril, ramipril, benazepril, benazepril-hctz, quinapril, quinapril-hctz, enalapril, enalapril-hctz, losartan, losartan-hctz, valsartan, valsartan-hctz, irbesartan, irbesartan-hctz, olmesartan or olmesartan-hctz.   Reviewed patient's chart. It appears that he was on lisinopril-hctz before.   MW, please advise if you want [place] to appeal [employer]'s decision.

## 2018-12-30 NOTE — Telephone Encounter (Signed)
Prescription for Olmesartan 20/12.5 sent to pharmacy per MW. Patient notified.

## 2018-12-30 NOTE — Telephone Encounter (Signed)
I called and spoke with the patient and made him aware that insurance did not want to cover the Telmisartan and that Dr. Melvyn Novas will change him to a similar medication that is covered called Olmesartan. He verbalized understanding.  Dr. Melvyn Novas please advise in strength as I could not fine the 20/25, did you mean 20/12.5?

## 2018-12-30 NOTE — Addendum Note (Signed)
Addended by: Hildred Alamin I on: 12/30/2018 03:31 PM   Modules accepted: Orders

## 2018-12-30 NOTE — Telephone Encounter (Signed)
Yes ok to do the 20-12.5

## 2018-12-30 NOTE — Addendum Note (Signed)
Addended by: Hildred Alamin I on: 12/30/2018 03:18 PM   Modules accepted: Orders

## 2018-12-30 NOTE — Telephone Encounter (Signed)
olmesartan 20-25 one daily is the roughly the same rx as th 80-25 telmisartan

## 2019-01-07 ENCOUNTER — Encounter: Payer: Self-pay | Admitting: Gastroenterology

## 2019-01-09 ENCOUNTER — Encounter: Payer: Medicare HMO | Admitting: Gastroenterology

## 2019-01-12 ENCOUNTER — Telehealth: Payer: Self-pay | Admitting: Internal Medicine

## 2019-01-12 ENCOUNTER — Ambulatory Visit
Admission: RE | Admit: 2019-01-12 | Discharge: 2019-01-12 | Disposition: A | Discharge status: Home / Self Care | Payer: Medicare HMO | Source: Ambulatory Visit | Attending: Internal Medicine | Admitting: Internal Medicine

## 2019-01-12 ENCOUNTER — Other Ambulatory Visit: Payer: Self-pay

## 2019-01-12 ENCOUNTER — Other Ambulatory Visit: Payer: Self-pay | Admitting: Internal Medicine

## 2019-01-12 DIAGNOSIS — R918 Other nonspecific abnormal finding of lung field: Secondary | ICD-10-CM

## 2019-01-12 DIAGNOSIS — R9389 Abnormal findings on diagnostic imaging of other specified body structures: Secondary | ICD-10-CM

## 2019-01-12 NOTE — Telephone Encounter (Signed)
Routing to Dr. Melvyn Novas to complete documentation of telephone encounter.

## 2019-01-12 NOTE — Telephone Encounter (Signed)
They have attempted 4 times to get a IV in the patient arm. Unsuccessful. Wanting recommendations regarding if we still want with contrast or try again another day.   Call transferred to Dr. Melvyn Novas per his request.

## 2019-01-12 NOTE — Telephone Encounter (Signed)
Advised to proceed with non-contrasted study

## 2019-01-13 NOTE — Progress Notes (Signed)
Spoke with pt and notified of results per Dr. Wert. Pt verbalized understanding and denied any questions. 

## 2019-01-21 ENCOUNTER — Encounter: Payer: Self-pay | Admitting: Internal Medicine

## 2019-01-21 ENCOUNTER — Ambulatory Visit (INDEPENDENT_AMBULATORY_CARE_PROVIDER_SITE_OTHER): Payer: Medicare HMO | Admitting: Internal Medicine

## 2019-01-21 DIAGNOSIS — J449 Chronic obstructive pulmonary disease, unspecified: Secondary | ICD-10-CM | POA: Diagnosis not present

## 2019-01-21 DIAGNOSIS — I10 Essential (primary) hypertension: Secondary | ICD-10-CM | POA: Diagnosis not present

## 2019-01-21 DIAGNOSIS — R9389 Abnormal findings on diagnostic imaging of other specified body structures: Secondary | ICD-10-CM | POA: Diagnosis not present

## 2019-01-21 DIAGNOSIS — F17210 Nicotine dependence, cigarettes, uncomplicated: Secondary | ICD-10-CM | POA: Diagnosis not present

## 2019-01-21 NOTE — Progress Notes (Addendum)
Ruben CashMichael D Anderson, male    DOB: Jul 24, 1950,   MRN: [id number]   Brief patient profile:  [age] yobm active smoker from [place] asthma as child resolved by age 68-14 and able to work [employer] mostly doing delivery work and [place] to [place] with onset of doe around 2016 much worse x 2019  so referred to pulmonary clinic 12/22/2018 by Dr  Ruben Anderson     History of Present Illness  12/22/2018  Pulmonary/ 1st office eval/Wert maint on symbicort but unable to trigger  Chief Complaint  Patient presents with  . Pulmonary Consult    Referred by Dr. Leilani AbleBetti Anderson. Pt c/o SOB for the past 1-2 years. He gets SOB walking from room to room at home.   Dyspnea:  60 f- 75 ft and stops Cough: slt hoarse /dry daytime cough  Sleep: pillows x one or two s resp symptoms SABA use: none  ? Some better with symbicort but not hfa near 0% effective even with me triggering device for him  rec The key is to stop smoking completely before smoking completely stops you! Start BREO  100  One click each am  - take a deep drag or two to be sure you get it all in then rinse and gargle and spit Stop zestoretic (linsinopril)  And start micardis  80-25 one daily  - call if any problem getting this substitution Please remember to go to the  x-ray department  for your tests - we will call you with the results when they are available   Please schedule a follow up office visit in 4 weeks, sooner if needed  with all medications /inhalers/ solutions in hand so we can verify exactly what you are taking. This includes all medications from all doctors and over the counters    Virtual Visit via Telephone Note 01/21/2019   I connected with Ruben Anderson on 01/21/19 at  9:00 AM EST by telephone and verified that I am speaking with the correct person using two identifiers.   I discussed the limitations, risks, security and privacy concerns of performing an evaluation and management service  by telephone and the availability of in person appointments. I also discussed with the patient that there may be a patient responsible charge related to this service. The patient expressed understanding and agreed to proceed.   History of Present Illness: re ? COPD/ still smoking/ stopped breo sev days prior to televisit Copd ? Not much better on breo on vs off  Dyspnea:  Maybe a block which is better and no change on breo vs off Cough: much better off acei Sleeping: fine flat  SABA use: none  02: none    No obvious day to day or daytime variability or assoc excess/ purulent sputum or mucus plugs or hemoptysis or cp or chest tightness, subjective wheeze or overt sinus or hb symptoms.    Also denies any obvious fluctuation of symptoms with weather or environmental changes or other aggravating or alleviating factors except as outlined above.   Meds reviewed/ med reconciliation completed      Observations/Objective: Very upbeat, no cough or wob noted on interview   Assessment and Plan: See problem list for active a/p's   Follow Up Instructions: See avs for instructions unique to this ov which includes revised/ updated med list     I discussed the assessment and treatment plan with the patient. The patient was provided an opportunity  to ask questions and all were answered. The patient agreed with the plan and demonstrated an understanding of the instructions.   The patient was advised to call back or seek an in-person evaluation if the symptoms worsen or if the condition fails to improve as anticipated.  I provided 25  minutes of non-face-to-face time during this encounter.   Ruben Hughs, MD                                Assessment   COPD GOLD ?  active smoker Active smoker -  12/22/2018  After extensive coaching inhaler device,  effectiveness =   75% with dpi p loaded  But 0 with hfa even with assistance > try Breo 1 click each am   DDX of  difficult  airways management almost all start with A and  include Adherence, Ace Inhibitors, Acid Reflux, Active Sinus Disease, Alpha 1 Antitripsin deficiency, Anxiety masquerading as Airways dz,  ABPA,  Allergy(esp in young), Aspiration (esp in elderly), Adverse effects of meds,  Active smoking or vaping, A bunch of PE's (a small clot burden can't cause this syndrome unless there is already severe underlying pulm or vascular dz with poor reserve) plus two Bs  = Bronchiectasis and Beta blocker use..and one C= CHF  Adherence is always the initial "prime suspect" and is a multilayered concern that requires a "trust but verify" approach in every patient - starting with knowing how to use medications, especially inhalers, correctly, keeping up with refills and understanding the fundamental difference between maintenance and prns vs those medications only taken for a very short course and then stopped and not refilled.  - see device teaching - return with all meds in hand using a trust but verify approach to confirm accurate Medication  Reconciliation The principal here is that until we are certain that the  patients are doing what we've asked, it makes no sense to ask them to do more.   Active smoking > see sep a/p  ACEi adverse effects also  at the  top of the usual list of suspects and the only way to rule it out is a trial off > see a/p    ?allergy/ AB >  rx just with laba/ics for now given severity of protate enlargement would avoid LAMA   ? Anxiety/depression/ cognitive decline with deconditioning > usually at the bottom of this list of usual suspects but   may interfere with adherence and also interpretation of response or lack thereof to symptom management which can be quite subjective.   ? 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  Discussed in detail all the  indications, usual  risks and alternatives  relative to the benefits with patient who agrees to proceed with w/u as outlined.       Essential hypertension Dc acei 12/22/2018   In the best review of chronic cough to date ( NEJM 2016 375 1544-1551) ,  ACEi are now felt to cause cough in up to  20% of  pts which is a 4 fold increase from previous reports and does not include the variety of non-specific complaints we see in pulmonary clinic in pts on ACEi but previously attributed to another dx like  Copd/asthma and  include PNDS, throat and chest congestion, "bronchitis", unexplained dyspnea and noct "strangling" sensations, and hoarseness, but also  atypical /refractory GERD symptoms like dysphagia and "bad heartburn"   The only way I know  to prove this is not an "ACEi Case" is a trial off ACEi x a minimum of 4 weeks then regroup.   Try micardis 80-25 instead

## 2019-01-21 NOTE — Patient Instructions (Addendum)
The key is to stop smoking completely before smoking completely stops you!  No change in medications   Blood pressure will need to be monitored/ adjusted by your PCP (Dr Lin Landsman)  Please schedule a follow up visit in 6 months but call sooner if needed  with all medications /inhalers/ solutions in hand so we can verify exactly what you are taking. This includes all medications from all doctors and over the counters >>> chest cxray on return  >>> try trelegy if flares off breo but for now leave it off as he's not convinced it helped

## 2019-01-21 NOTE — Assessment & Plan Note (Signed)
Active smoker -  12/22/2018  After extensive coaching inhaler device,  effectiveness =   75% with dpi p loaded  But 0 with hfa even with assistance > try Breo 1 click each am  - 02/18/8249 reports able to walk to [place] (one block) much better off acei and no change on vs off breo so ok to leave off   If flares or worsens off Breo will add back Trelegy o/w no need for rx at this point

## 2019-01-21 NOTE — Assessment & Plan Note (Signed)
Dc acei 12/22/2018  Due to cough ? Pseudoasthma > better 01/21/2019 off all copd/asthma meds  Although even in retrospect it may not be clear the ACEi contributed to the pt's symptoms,  Pt improved off them and adding them back at this point or in the future would risk confusion in interpretation of non-specific respiratory symptoms to which this patient is prone  ie  Better not to muddy the waters here.   >>> f/u per PCP    Each maintenance medication was reviewed in detail including most importantly the difference between maintenance and as needed and under what circumstances the prns are to be used.  Please see AVS for specific  Instructions which are unique to this visit and I personally typed out  which were reviewed in detail over the phone with the patient and a copy provided via mail as does not have MyChart

## 2019-01-21 NOTE — Assessment & Plan Note (Signed)
See CT chest 08/05/18  - more of a mass like appearance on PA view 12/22/2018 > rec repeat CT with contrast  - CT chest 01/12/2019  1. Comparing to prior CT chest of 08/05/2018, the right middle and lower lobe chronic collapse/scarring is unchanged. 2. Small polypoid focus along a right upper lobe bronchus is new since prior and probably reflects mucus or secretions. Attention on follow-up recommended. 3. Expansile sclerotic and lucent lesion in the posterior left fourth rib is similar. 5 month interval stability is reassuring. Repeat CT chest in 6 months could be used to ensure continued stability. Nuclear medicine bone scan may prove helpful to assess for active bony turnover in this lesion. 4.  Emphysema. (ICD10-J43.9) 5.  Aortic Atherosclerois (ICD10-170.0)   Discussed in detail all the  indications, usual  risks and alternatives  relative to the benefits with patient who agrees to proceed with conservative  f/u in 6 m with cxr then decide whether repeat ct needed

## 2019-01-21 NOTE — Assessment & Plan Note (Signed)
Counseled re importance of smoking cessation but did not meet time criteria for separate billing   °

## 2019-01-27 ENCOUNTER — Telehealth: Payer: Self-pay

## 2019-01-27 NOTE — Telephone Encounter (Signed)
Patient No Showed for PV. Patient was called to reschedule. No answer. A message was left on voicemail to call and reschedule before 5:00 Pm today. A no show letter was mailed. Procedure was cancelled per [facility] guidelines.  Riki Sheer, LPN P ( PV )

## 2019-02-10 ENCOUNTER — Encounter: Payer: Medicare HMO | Admitting: Gastroenterology

## 2019-02-15 ENCOUNTER — Emergency Department (HOSPITAL_COMMUNITY): Payer: Medicare HMO

## 2019-02-15 ENCOUNTER — Inpatient Hospital Stay (HOSPITAL_COMMUNITY)
Admission: EM | Admit: 2019-02-15 | Discharge: 2019-02-27 | DRG: 207 | Disposition: E | Payer: Medicare HMO | Attending: Pulmonary Disease | Admitting: Pulmonary Disease

## 2019-02-15 DIAGNOSIS — Z20828 Contact with and (suspected) exposure to other viral communicable diseases: Secondary | ICD-10-CM | POA: Diagnosis present

## 2019-02-15 DIAGNOSIS — G9349 Other encephalopathy: Secondary | ICD-10-CM | POA: Diagnosis present

## 2019-02-15 DIAGNOSIS — J9602 Acute respiratory failure with hypercapnia: Secondary | ICD-10-CM

## 2019-02-15 DIAGNOSIS — J9622 Acute and chronic respiratory failure with hypercapnia: Secondary | ICD-10-CM | POA: Diagnosis present

## 2019-02-15 DIAGNOSIS — G931 Anoxic brain damage, not elsewhere classified: Secondary | ICD-10-CM | POA: Diagnosis present

## 2019-02-15 DIAGNOSIS — Z66 Do not resuscitate: Secondary | ICD-10-CM | POA: Diagnosis not present

## 2019-02-15 DIAGNOSIS — R404 Transient alteration of awareness: Secondary | ICD-10-CM | POA: Diagnosis present

## 2019-02-15 DIAGNOSIS — Z9911 Dependence on respirator [ventilator] status: Secondary | ICD-10-CM | POA: Diagnosis not present

## 2019-02-15 DIAGNOSIS — R911 Solitary pulmonary nodule: Secondary | ICD-10-CM | POA: Diagnosis present

## 2019-02-15 DIAGNOSIS — I110 Hypertensive heart disease with heart failure: Secondary | ICD-10-CM | POA: Diagnosis present

## 2019-02-15 DIAGNOSIS — I4510 Unspecified right bundle-branch block: Secondary | ICD-10-CM | POA: Diagnosis present

## 2019-02-15 DIAGNOSIS — E872 Acidosis: Secondary | ICD-10-CM | POA: Diagnosis present

## 2019-02-15 DIAGNOSIS — R34 Anuria and oliguria: Secondary | ICD-10-CM | POA: Diagnosis not present

## 2019-02-15 DIAGNOSIS — J9811 Atelectasis: Secondary | ICD-10-CM | POA: Diagnosis present

## 2019-02-15 DIAGNOSIS — N179 Acute kidney failure, unspecified: Secondary | ICD-10-CM | POA: Diagnosis not present

## 2019-02-15 DIAGNOSIS — J439 Emphysema, unspecified: Secondary | ICD-10-CM | POA: Diagnosis present

## 2019-02-15 DIAGNOSIS — R451 Restlessness and agitation: Secondary | ICD-10-CM | POA: Diagnosis not present

## 2019-02-15 DIAGNOSIS — M8588 Other specified disorders of bone density and structure, other site: Secondary | ICD-10-CM | POA: Diagnosis present

## 2019-02-15 DIAGNOSIS — R000 Tachycardia, unspecified: Secondary | ICD-10-CM | POA: Diagnosis present

## 2019-02-15 DIAGNOSIS — D72829 Elevated white blood cell count, unspecified: Secondary | ICD-10-CM | POA: Diagnosis not present

## 2019-02-15 DIAGNOSIS — J9601 Acute respiratory failure with hypoxia: Secondary | ICD-10-CM

## 2019-02-15 DIAGNOSIS — I469 Cardiac arrest, cause unspecified: Secondary | ICD-10-CM | POA: Diagnosis present

## 2019-02-15 DIAGNOSIS — R4182 Altered mental status, unspecified: Secondary | ICD-10-CM

## 2019-02-15 DIAGNOSIS — Z9981 Dependence on supplemental oxygen: Secondary | ICD-10-CM

## 2019-02-15 DIAGNOSIS — Z515 Encounter for palliative care: Secondary | ICD-10-CM | POA: Diagnosis not present

## 2019-02-15 DIAGNOSIS — R339 Retention of urine, unspecified: Secondary | ICD-10-CM | POA: Diagnosis not present

## 2019-02-15 DIAGNOSIS — F17210 Nicotine dependence, cigarettes, uncomplicated: Secondary | ICD-10-CM | POA: Diagnosis present

## 2019-02-15 DIAGNOSIS — J9621 Acute and chronic respiratory failure with hypoxia: Secondary | ICD-10-CM | POA: Diagnosis present

## 2019-02-15 DIAGNOSIS — J9690 Respiratory failure, unspecified, unspecified whether with hypoxia or hypercapnia: Secondary | ICD-10-CM

## 2019-02-15 DIAGNOSIS — I422 Other hypertrophic cardiomyopathy: Secondary | ICD-10-CM | POA: Diagnosis present

## 2019-02-15 DIAGNOSIS — I340 Nonrheumatic mitral (valve) insufficiency: Secondary | ICD-10-CM | POA: Diagnosis not present

## 2019-02-15 DIAGNOSIS — I5043 Acute on chronic combined systolic (congestive) and diastolic (congestive) heart failure: Secondary | ICD-10-CM | POA: Diagnosis present

## 2019-02-15 DIAGNOSIS — Z6825 Body mass index (BMI) 25.0-25.9, adult: Secondary | ICD-10-CM

## 2019-02-15 DIAGNOSIS — E876 Hypokalemia: Secondary | ICD-10-CM | POA: Diagnosis present

## 2019-02-15 DIAGNOSIS — R570 Cardiogenic shock: Secondary | ICD-10-CM | POA: Diagnosis present

## 2019-02-15 DIAGNOSIS — R569 Unspecified convulsions: Secondary | ICD-10-CM | POA: Diagnosis present

## 2019-02-15 DIAGNOSIS — R64 Cachexia: Secondary | ICD-10-CM | POA: Diagnosis not present

## 2019-02-15 LAB — POCT I-STAT 7, (LYTES, BLD GAS, ICA,H+H)
Acid-Base Excess: 2 mmol/L (ref 0.0–2.0)
Acid-Base Excess: 6 mmol/L — ABNORMAL HIGH (ref 0.0–2.0)
Bicarbonate: 34 mmol/L — ABNORMAL HIGH (ref 20.0–28.0)
Bicarbonate: 31.8 mmol/L — ABNORMAL HIGH (ref 20.0–28.0)
Calcium, Ion: 1.15 mmol/L (ref 1.15–1.40)
Calcium, Ion: 1.13 mmol/L — ABNORMAL LOW (ref 1.15–1.40)
HCT: 49 % (ref 39.0–52.0)
HCT: 45 % (ref 39.0–52.0)
Hemoglobin: 16.7 g/dL (ref 13.0–17.0)
Hemoglobin: 15.3 g/dL (ref 13.0–17.0)
O2 Saturation: 100 %
O2 Saturation: 94 %
Patient temperature: 98.6
Patient temperature: 98.6
Potassium: 3.1 mmol/L — ABNORMAL LOW (ref 3.5–5.1)
Potassium: 3.5 mmol/L (ref 3.5–5.1)
Sodium: 142 mmol/L (ref 135–145)
Sodium: 140 mmol/L (ref 135–145)
TCO2: 37 mmol/L — ABNORMAL HIGH (ref 22–32)
TCO2: 33 mmol/L — ABNORMAL HIGH (ref 22–32)
pCO2 arterial: 87.9 mmHg (ref 32.0–48.0)
pCO2 arterial: 46.9 mmHg (ref 32.0–48.0)
pH, Arterial: 7.195 — CL (ref 7.350–7.450)
pH, Arterial: 7.439 (ref 7.350–7.450)
pO2, Arterial: 384 mmHg — ABNORMAL HIGH (ref 83.0–108.0)
pO2, Arterial: 71 mmHg — ABNORMAL LOW (ref 83.0–108.0)

## 2019-02-15 LAB — CBC WITH DIFFERENTIAL/PLATELET
Abs Immature Granulocytes: 0.2 10*3/uL — ABNORMAL HIGH (ref 0.00–0.07)
Basophils Absolute: 0 10*3/uL (ref 0.0–0.1)
Basophils Relative: 0 %
Eosinophils Absolute: 0 10*3/uL (ref 0.0–0.5)
Eosinophils Relative: 0 %
HCT: 43.9 % (ref 39.0–52.0)
Hemoglobin: 13.6 g/dL (ref 13.0–17.0)
Immature Granulocytes: 2 %
Lymphocytes Relative: 6 %
Lymphs Abs: 0.8 10*3/uL (ref 0.7–4.0)
MCH: 27.9 pg (ref 26.0–34.0)
MCHC: 31 g/dL (ref 30.0–36.0)
MCV: 90 fL (ref 80.0–100.0)
Monocytes Absolute: 0.2 10*3/uL (ref 0.1–1.0)
Monocytes Relative: 1 %
Neutro Abs: 10.9 10*3/uL — ABNORMAL HIGH (ref 1.7–7.7)
Neutrophils Relative %: 91 %
Platelets: 212 10*3/uL (ref 150–400)
RBC: 4.88 MIL/uL (ref 4.22–5.81)
RDW: 13 % (ref 11.5–15.5)
WBC: 12.1 10*3/uL — ABNORMAL HIGH (ref 4.0–10.5)
nRBC: 0.2 % (ref 0.0–0.2)

## 2019-02-15 LAB — POCT I-STAT, CHEM 8
BUN: 26 mg/dL — ABNORMAL HIGH (ref 8–23)
Calcium, Ion: 1.07 mmol/L — ABNORMAL LOW (ref 1.15–1.40)
Chloride: 96 mmol/L — ABNORMAL LOW (ref 98–111)
Creatinine, Ser: 1 mg/dL (ref 0.61–1.24)
Glucose, Bld: 136 mg/dL — ABNORMAL HIGH (ref 70–99)
HCT: 48 % (ref 39.0–52.0)
Hemoglobin: 16.3 g/dL (ref 13.0–17.0)
Potassium: 3.6 mmol/L (ref 3.5–5.1)
Sodium: 143 mmol/L (ref 135–145)
TCO2: 35 mmol/L — ABNORMAL HIGH (ref 22–32)

## 2019-02-15 LAB — LACTIC ACID, PLASMA
Lactic Acid, Venous: 5.5 mmol/L (ref 0.5–1.9)
Lactic Acid, Venous: 2.8 mmol/L (ref 0.5–1.9)

## 2019-02-15 LAB — COMPREHENSIVE METABOLIC PANEL
ALT: 294 U/L — ABNORMAL HIGH (ref 0–44)
AST: 269 U/L — ABNORMAL HIGH (ref 15–41)
Albumin: 2.6 g/dL — ABNORMAL LOW (ref 3.5–5.0)
Alkaline Phosphatase: 50 U/L (ref 38–126)
Anion gap: 17 — ABNORMAL HIGH (ref 5–15)
BUN: 17 mg/dL (ref 8–23)
CO2: 27 mmol/L (ref 22–32)
Calcium: 8.2 mg/dL — ABNORMAL LOW (ref 8.9–10.3)
Chloride: 101 mmol/L (ref 98–111)
Creatinine, Ser: 0.89 mg/dL (ref 0.61–1.24)
GFR calc Af Amer: >60 mL/min (ref >60)
GFR calc non Af Amer: >60 mL/min (ref >60)
Glucose, Bld: 160 mg/dL — ABNORMAL HIGH (ref 70–99)
Potassium: 3.2 mmol/L — ABNORMAL LOW (ref 3.5–5.1)
Sodium: 145 mmol/L (ref 135–145)
Total Bilirubin: 1 mg/dL (ref 0.3–1.2)
Total Protein: 5.6 g/dL — ABNORMAL LOW (ref 6.5–8.1)

## 2019-02-15 LAB — RAPID URINE DRUG SCREEN, HOSP PERFORMED
Amphetamines: NOT DETECTED
Barbiturates: NOT DETECTED
Benzodiazepines: NOT DETECTED
Cocaine: NOT DETECTED
Opiates: NOT DETECTED
Tetrahydrocannabinol: NOT DETECTED

## 2019-02-15 LAB — BASIC METABOLIC PANEL
Anion gap: 9 (ref 5–15)
BUN: 23 mg/dL (ref 8–23)
CO2: 33 mmol/L — ABNORMAL HIGH (ref 22–32)
Calcium: 8.3 mg/dL — ABNORMAL LOW (ref 8.9–10.3)
Chloride: 101 mmol/L (ref 98–111)
Creatinine, Ser: 1.12 mg/dL (ref 0.61–1.24)
GFR calc Af Amer: >60 mL/min (ref >60)
GFR calc non Af Amer: >60 mL/min (ref >60)
Glucose, Bld: 142 mg/dL — ABNORMAL HIGH (ref 70–99)
Potassium: 3.6 mmol/L (ref 3.5–5.1)
Sodium: 143 mmol/L (ref 135–145)

## 2019-02-15 LAB — TROPONIN I (HIGH SENSITIVITY)
Troponin I (High Sensitivity): 996 ng/L (ref <18)
Troponin I (High Sensitivity): 858 ng/L (ref <18)

## 2019-02-15 LAB — GLUCOSE, CAPILLARY
Glucose-Capillary: 116 mg/dL — ABNORMAL HIGH (ref 70–99)
Glucose-Capillary: 106 mg/dL — ABNORMAL HIGH (ref 70–99)

## 2019-02-15 LAB — URINALYSIS, COMPLETE (UACMP) WITH MICROSCOPIC
Bacteria, UA: NONE SEEN
Bilirubin Urine: NEGATIVE
Glucose, UA: 50 mg/dL — AB
Ketones, ur: NEGATIVE mg/dL
Nitrite: NEGATIVE
Protein, ur: 100 mg/dL — AB
Specific Gravity, Urine: 1.024 (ref 1.005–1.030)
WBC, UA: >50 WBC/hpf — ABNORMAL HIGH (ref 0–5)
pH: 5 (ref 5.0–8.0)

## 2019-02-15 LAB — HEMOGLOBIN A1C
Hgb A1c MFr Bld: 6.1 % — ABNORMAL HIGH (ref 4.8–5.6)
Mean Plasma Glucose: 128.37 mg/dL

## 2019-02-15 LAB — RESPIRATORY PANEL BY RT PCR (FLU A&B, COVID)
Influenza A by PCR: NEGATIVE
Influenza B by PCR: NEGATIVE
SARS Coronavirus 2 by RT PCR: NEGATIVE

## 2019-02-15 LAB — ETHANOL: Alcohol, Ethyl (B): <10 mg/dL (ref <10)

## 2019-02-15 LAB — PROTIME-INR
INR: 1.3 — ABNORMAL HIGH (ref 0.8–1.2)
Prothrombin Time: 15.7 seconds — ABNORMAL HIGH (ref 11.4–15.2)

## 2019-02-15 LAB — PROCALCITONIN: Procalcitonin: <0.1 ng/mL

## 2019-02-15 LAB — MRSA PCR SCREENING: MRSA by PCR: NEGATIVE

## 2019-02-15 LAB — CBG MONITORING, ED: Glucose-Capillary: 101 mg/dL — ABNORMAL HIGH (ref 70–99)

## 2019-02-15 MED ORDER — SODIUM CHLORIDE 0.9 % IV SOLN
INTRAVENOUS | Status: DC
  Administered 2019-02-18: 11:00:00 900 mL via INTRAVENOUS

## 2019-02-15 MED ORDER — PROPOFOL 1000 MG/100ML IV EMUL
5.0000 ug/kg/min | INTRAVENOUS | Status: DC
  Administered 2019-02-15: 23:00:00 10 ug/kg/min via INTRAVENOUS
  Administered 2019-02-16: 22:00:00 15.068 ug/kg/min via INTRAVENOUS
  Administered 2019-02-16: 15 ug/kg/min via INTRAVENOUS
  Filled 2019-02-15 (×3): qty 100

## 2019-02-15 MED ORDER — ROCURONIUM BROMIDE 50 MG/5ML IV SOLN
INTRAVENOUS | Status: AC | PRN
  Administered 2019-02-15: 100 mg via INTRAVENOUS

## 2019-02-15 MED ORDER — SODIUM CHLORIDE 0.9 % IV SOLN
2.0000 g | INTRAVENOUS | Status: DC
  Administered 2019-02-15 – 2019-02-16 (×2): 2 g via INTRAVENOUS
  Filled 2019-02-15: qty 20
  Filled 2019-02-15: qty 2

## 2019-02-15 MED ORDER — ARFORMOTEROL TARTRATE 15 MCG/2ML IN NEBU
15.0000 ug | INHALATION_SOLUTION | Freq: Two times a day (BID) | RESPIRATORY_TRACT | Status: DC
  Administered 2019-02-15 – 2019-02-22 (×14): 15 ug via RESPIRATORY_TRACT
  Filled 2019-02-15 (×14): qty 2

## 2019-02-15 MED ORDER — PROPOFOL 1000 MG/100ML IV EMUL
INTRAVENOUS | Status: AC
  Filled 2019-02-15: qty 100

## 2019-02-15 MED ORDER — MIDAZOLAM BOLUS VIA INFUSION
1.0000 mg | INTRAVENOUS | Status: DC | PRN
  Filled 2019-02-15: qty 1

## 2019-02-15 MED ORDER — BUDESONIDE 0.5 MG/2ML IN SUSP
0.5000 mg | Freq: Two times a day (BID) | RESPIRATORY_TRACT | Status: DC
  Administered 2019-02-15 – 2019-02-22 (×14): 0.5 mg via RESPIRATORY_TRACT
  Filled 2019-02-15 (×14): qty 2

## 2019-02-15 MED ORDER — NOREPINEPHRINE 4 MG/250ML-% IV SOLN
0.0000 ug/min | INTRAVENOUS | Status: DC
  Administered 2019-02-15: 18:00:00 5 ug/min via INTRAVENOUS
  Administered 2019-02-15: 16:00:00 30.133 ug/min via INTRAVENOUS
  Filled 2019-02-15 (×2): qty 250

## 2019-02-15 MED ORDER — ROCURONIUM BROMIDE 50 MG/5ML IV SOLN
100.0000 mg | Freq: Once | INTRAVENOUS | Status: DC
  Filled 2019-02-15: qty 10

## 2019-02-15 MED ORDER — SODIUM CHLORIDE 0.9 % IV SOLN
INTRAVENOUS | Status: DC | PRN
  Administered 2019-02-15: 17:00:00 500 mL via INTRAVENOUS

## 2019-02-15 MED ORDER — INSULIN ASPART 100 UNIT/ML ~~LOC~~ SOLN
0.0000 [IU] | SUBCUTANEOUS | Status: DC
  Administered 2019-02-19 – 2019-02-20 (×3): 1 [IU] via SUBCUTANEOUS

## 2019-02-15 MED ORDER — CALCIUM GLUCONATE-NACL 1-0.675 GM/50ML-% IV SOLN
1.0000 g | Freq: Once | INTRAVENOUS | Status: AC
  Administered 2019-02-15: 17:00:00 1000 mg via INTRAVENOUS
  Filled 2019-02-15: qty 50

## 2019-02-15 MED ORDER — ETOMIDATE 2 MG/ML IV SOLN
INTRAVENOUS | Status: AC | PRN
  Administered 2019-02-15: 20 mg via INTRAVENOUS

## 2019-02-15 MED ORDER — MIDAZOLAM HCL 2 MG/2ML IJ SOLN
1.0000 mg | INTRAMUSCULAR | Status: DC | PRN
  Administered 2019-02-15: 1 mg via INTRAVENOUS

## 2019-02-15 MED ORDER — CHLORHEXIDINE GLUCONATE CLOTH 2 % EX PADS
6.0000 | MEDICATED_PAD | Freq: Every day | CUTANEOUS | Status: DC
  Administered 2019-02-15 – 2019-02-21 (×7): 6 via TOPICAL

## 2019-02-15 MED ORDER — ORAL CARE MOUTH RINSE
15.0000 mL | OROMUCOSAL | Status: DC
  Administered 2019-02-15 – 2019-02-22 (×65): 15 mL via OROMUCOSAL

## 2019-02-15 MED ORDER — FENTANYL CITRATE (PF) 100 MCG/2ML IJ SOLN
50.0000 ug | INTRAMUSCULAR | Status: DC | PRN
  Administered 2019-02-15 – 2019-02-22 (×11): 50 ug via INTRAVENOUS
  Filled 2019-02-15 (×10): qty 2

## 2019-02-15 MED ORDER — IPRATROPIUM-ALBUTEROL 0.5-2.5 (3) MG/3ML IN SOLN: 3.0000 mL | Freq: Four times a day (QID) | RESPIRATORY_TRACT | Status: DC | PRN

## 2019-02-15 MED ORDER — CHLORHEXIDINE GLUCONATE 0.12% ORAL RINSE (MEDLINE KIT)
15.0000 mL | Freq: Two times a day (BID) | OROMUCOSAL | Status: DC
  Administered 2019-02-15 – 2019-02-22 (×14): 15 mL via OROMUCOSAL

## 2019-02-15 MED ORDER — NOREPINEPHRINE 4 MG/250ML-% IV SOLN
0.0000 ug/min | INTRAVENOUS | Status: DC
  Administered 2019-02-15: 12:00:00 10 ug/min via INTRAVENOUS
  Filled 2019-02-15: qty 250

## 2019-02-15 MED ORDER — FENTANYL CITRATE (PF) 100 MCG/2ML IJ SOLN: 50.0000 ug | Freq: Once | INTRAMUSCULAR | Status: DC

## 2019-02-15 MED ORDER — MIDAZOLAM 50MG/50ML (1MG/ML) PREMIX INFUSION: 2.0000 mg/h | INTRAVENOUS | Status: DC

## 2019-02-15 MED ORDER — FENTANYL BOLUS VIA INFUSION: 25.0000 ug | INTRAVENOUS | Status: DC | PRN

## 2019-02-15 MED ORDER — FENTANYL 2500MCG IN NS 250ML (10MCG/ML) PREMIX INFUSION
0.0000 ug/h | INTRAVENOUS | Status: DC
  Administered 2019-02-15: 17:00:00 100 ug/h via INTRAVENOUS
  Administered 2019-02-16 (×2): 150 ug/h via INTRAVENOUS
  Administered 2019-02-17: 22:00:00 100 ug/h via INTRAVENOUS
  Filled 2019-02-15 (×4): qty 250

## 2019-02-15 MED ORDER — SODIUM CHLORIDE 0.9 % IV SOLN: INTRAVENOUS | Status: DC

## 2019-02-15 MED ORDER — FENTANYL CITRATE (PF) 100 MCG/2ML IJ SOLN
50.0000 ug | INTRAMUSCULAR | Status: AC | PRN
  Administered 2019-02-21 – 2019-02-22 (×3): 50 ug via INTRAVENOUS
  Filled 2019-02-15 (×3): qty 2

## 2019-02-15 MED ORDER — ASPIRIN 300 MG RE SUPP
300.0000 mg | RECTAL | Status: AC
  Administered 2019-02-15: 300 mg via RECTAL
  Filled 2019-02-15: qty 1

## 2019-02-15 MED ORDER — HEPARIN SODIUM (PORCINE) 5000 UNIT/ML IJ SOLN
5000.0000 [IU] | Freq: Three times a day (TID) | INTRAMUSCULAR | Status: DC
  Administered 2019-02-15 – 2019-02-22 (×20): 5000 [IU] via SUBCUTANEOUS
  Filled 2019-02-15 (×20): qty 1

## 2019-02-15 MED ORDER — PANTOPRAZOLE SODIUM 40 MG IV SOLR
40.0000 mg | Freq: Every day | INTRAVENOUS | Status: DC
  Administered 2019-02-15 – 2019-02-17 (×3): 40 mg via INTRAVENOUS
  Filled 2019-02-15 (×3): qty 40

## 2019-02-15 MED ORDER — SODIUM CHLORIDE 0.9% FLUSH
10.0000 mL | Freq: Two times a day (BID) | INTRAVENOUS | Status: DC
  Administered 2019-02-15 – 2019-02-20 (×7): 10 mL

## 2019-02-15 MED ORDER — POTASSIUM CHLORIDE 10 MEQ/50ML IV SOLN
10.0000 meq | INTRAVENOUS | Status: AC
  Administered 2019-02-15 (×2): 10 meq via INTRAVENOUS
  Filled 2019-02-15 (×3): qty 50

## 2019-02-15 MED ORDER — CALCIUM GLUCONATE-NACL 1-0.675 GM/50ML-% IV SOLN
1.0000 g | Freq: Once | INTRAVENOUS | Status: AC
  Administered 2019-02-15: 20:00:00 1000 mg via INTRAVENOUS
  Filled 2019-02-15: qty 50

## 2019-02-15 MED ORDER — MIDAZOLAM HCL 2 MG/2ML IJ SOLN: 1.0000 mg | Freq: Once | INTRAMUSCULAR | Status: DC

## 2019-02-15 MED ORDER — SODIUM CHLORIDE 0.9 % IV BOLUS
1000.0000 mL | Freq: Once | INTRAVENOUS | Status: AC
  Administered 2019-02-15: 11:00:00 1000 mL via INTRAVENOUS

## 2019-02-15 MED ORDER — MIDAZOLAM HCL 2 MG/2ML IJ SOLN
1.0000 mg | INTRAMUSCULAR | Status: DC | PRN
  Administered 2019-02-15: 1 mg via INTRAVENOUS
  Filled 2019-02-15 (×2): qty 2

## 2019-02-15 MED ORDER — SODIUM CHLORIDE 0.9% FLUSH: 10.0000 mL | INTRAVENOUS | Status: DC | PRN

## 2019-02-15 MED ORDER — LORAZEPAM 2 MG/ML IJ SOLN: 2.0000 mg | INTRAMUSCULAR | Status: DC | PRN

## 2019-02-15 MED ORDER — ETOMIDATE 2 MG/ML IV SOLN
20.0000 mg | Freq: Once | INTRAVENOUS | Status: DC
  Filled 2019-02-15: qty 10

## 2019-02-15 NOTE — Progress Notes (Signed)
CRITICAL VALUE ALERT  Critical Value:  Troponin 996  Date & Time Notied:  8251  Provider Notified: Francine Graven CCM  Orders Received/Actions taken: None

## 2019-02-15 NOTE — ED Notes (Signed)
Attempted report 

## 2019-02-15 NOTE — Progress Notes (Signed)
Patient came in intubated via king airway MD exchanged with a 7.5 ETT taped at 24 at lip, good color change on ETCO2 detector, equal BBS, SATS 99%, placed on above vent settings per ARDS net protocol, will continue to monitor patient.

## 2019-02-15 NOTE — Progress Notes (Signed)
[facility] Progress Note Patient Name: Ruben Anderson DOB: [birth year] MRN: [id number]   Date of Service  01/31/2019  HPI/Events of Note  Notified of seizure like activity. Currently sedated on Fentanyl 250. Target temp reached at 19:50  eICU Interventions  Ordered to start propofol for sedation and seizure control Also ordered ativan prn for seizure     Intervention Category Major Interventions: Seizures - evaluation and management  Judd Lien 02/21/2019, 11:03 PM

## 2019-02-15 NOTE — ED Triage Notes (Signed)
Pt here post arrest after calling family and asking them to call [phone] lsn 10 pm pt was found pulseless and apneic PEA on monitor , pt received 10 mins of cpr , 2 epi's and regained pulses , pt arrived with king airway

## 2019-02-15 NOTE — Progress Notes (Signed)
This chaplain responded to [employer] in [place].  The chaplain understands no Pt. needs at this time.  The chaplain was informed by RN contact with the Pt. family has occurred.  The chaplain is available for F/U spiritual care as needed.

## 2019-02-15 NOTE — ED Provider Notes (Addendum)
[facility] EMERGENCY DEPARTMENT Provider Note   CSN: [id number] Arrival date & time: 02/16/2019  1004     History No chief complaint on file.   Ruben GreenhouseMichael Anderson is a 68 y.o. male.  HPI    Presents in extremis, via EMS. Patient is unresponsive, level 5 caveat. Reportedly the patient was last in the" normal" state of affairs yesterday about 12 hours ago, but today called family members stated that he needed an ambulance.  On EMS arrival the patient was unresponsive, with asystole or PEA - unclear.  Patient received CPR, and after approximately 10 minutes, reportedly, the patient had return of spontaneous circulation, he had no interactivity at that point, nor has he had any since in transport. EMS providers note [facility], had IO placed, received fluids. He was reportedly hypotensive in route. No past medical history on file. History not available secondary to the patient's acuity of condition No family history on file.  Social History   Tobacco Use  . Smoking status: Not on file  Substance Use Topics  . Alcohol use: Not on file  . Drug use: Not on file    Home Medications Prior to Admission medications   Not on File    Allergies    Patient has no allergy information on record.  Review of Systems   Review of Systems  Unable to perform ROS: Acuity of condition    Physical Exam Updated Vital Signs BP (!) 77/55   Pulse 83   Temp 98.3 F (36.8 C) (Rectal)   Resp (!) 24   Ht 5\' 9"  (1.753 m)   SpO2 100%   Physical Exam Vitals and nursing note reviewed.  Constitutional:      General: He is in acute distress.     Appearance: He is well-developed. He is ill-appearing.     Comments: Unresponsive thin adult male  HENT:     Head: Normocephalic and atraumatic.  Eyes:     Comments: Dry conjunctiva, pinpoint pupils bilaterally  Cardiovascular:     Rate and Rhythm: Normal rate and regular rhythm.   Pulmonary:     Comments: Slight rhonchi bilaterally with assisted ventilation Abdominal:     General: There is no distension.  Genitourinary:    Penis: Normal.   Musculoskeletal:        General: No deformity.  Skin:    General: Skin is warm and dry.  Neurological:     Comments: Unresponsive  Psychiatric:     Comments: Cognition impaired     ED Results / Procedures / Treatments   Labs (all labs ordered are listed, but only abnormal results are displayed) Labs Reviewed  CBC WITH DIFFERENTIAL/PLATELET - Abnormal; Notable for the following components:      Result Value   WBC 12.1 (*)    Neutro Abs 10.9 (*)    Abs Immature Granulocytes 0.20 (*)    All other components within normal limits  PROTIME-INR - Abnormal; Notable for the following components:   Prothrombin Time 15.7 (*)    INR 1.3 (*)    All other components within normal limits  CBG MONITORING, ED - Abnormal; Notable for the following components:   Glucose-Capillary 101 (*)    All other components within normal limits  POCT I-STAT 7, (LYTES, BLD GAS, ICA,H+H) - Abnormal; Notable for the following components:   pH, Arterial 7.195 (*)    pCO2 arterial 87.9 (*)    pO2, Arterial 384.0 (*)  Bicarbonate 34.0 (*)    TCO2 37 (*)    Potassium 3.1 (*)    All other components within normal limits  RESPIRATORY PANEL BY RT PCR (FLU A&B, COVID)  COMPREHENSIVE METABOLIC PANEL  RAPID URINE DRUG SCREEN, HOSP PERFORMED  ETHANOL  URINALYSIS, COMPLETE (UACMP) WITH MICROSCOPIC  LACTIC ACID, PLASMA  I-STAT ARTERIAL BLOOD GAS, ED    EKG EKG Interpretation  Date/Time:  Sunday 13-Mar-2019 10:23:18 EST Ventricular Rate:  124 PR Interval:    QRS Duration: 172 QT Interval:  408 QTC Calculation: 587 R Axis:   -105 Text Interpretation: Sinus tachycardia Right bundle branch block Confirmed by Gerhard Munch [phone]) on 2019-03-13 11:53:11 AM   Radiology CT HEAD WO CONTRAST  Result Date: 03-13-19 CLINICAL DATA:   Cardiac arrest. EXAM: CT HEAD WITHOUT CONTRAST TECHNIQUE: Contiguous axial images were obtained from the base of the skull through the vertex without intravenous contrast. COMPARISON:  None. FINDINGS: Brain: No evidence of acute infarction, hemorrhage, hydrocephalus, extra-axial collection or mass lesion/mass effect. 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 Vascular: No hyperdense vessel or unexpected calcification. Skull: Normal. Negative for fracture or focal lesion. Sinuses/Orbits: No acute finding. Ethmoid air cell mucosal thickening. Soft tissue density within the left external auditory canal with erosion of the adjacent temporal bone (series 4, image 11) and bulging of the tympanic membrane. No definite extension into the middle ear. Partial opacification of the left mastoid air cells. Other: None. IMPRESSION: 1. No acute intracranial abnormality. 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 2. Soft tissue density within the left external auditory canal with erosion of the adjacent temporal bone. Differential considerations include cholesteatoma or squamous cell carcinoma. Recommend ENT consultation for direct visualization. Electronically Signed   By: Obie Dredge M.D.   On: 03-13-2019 11:47   DG Chest Port 1 View  Result Date: Mar 13, 2019 CLINICAL DATA:  Cardiac arrest. EXAM: PORTABLE CHEST 1 VIEW COMPARISON:  CT chest dated January 12, 2019. Chest x-ray dated December 22, 2018. FINDINGS: Endotracheal tube in place with the tip 6.7 cm above the carina. Enteric tube in the stomach. The heart size and mediastinal contours are within normal limits. Normal pulmonary vascularity. The lungs remain hyperinflated. Chronic scarring at the right lung base. No focal consolidation, pleural effusion, or pneumothorax. No acute osseous abnormality. IMPRESSION: 1. Appropriately positioned endotracheal and enteric tubes. 2. COPD without active disease. Electronically Signed   By: Obie Dredge  M.D.   On: 2019-03-13 11:33    Procedures Procedure Name: Intubation Date/Time: 03/13/19 10:30 AM Performed by: Gerhard Munch, MD Pre-anesthesia Checklist: Patient identified, Patient being monitored, Emergency Drugs available, Timeout performed and Suction available Oxygen Delivery Method: Non-rebreather mask Preoxygenation: Pre-oxygenation with 100% oxygen Induction Type: Rapid sequence Ventilation: Mask ventilation without difficulty Laryngoscope Size: Glidescope and 4 Grade View: Grade I Tube type: Subglottic suction tube Tube size: 7.5 mm Number of attempts: 1 Placement Confirmation: ETT inserted through vocal cords under direct vision,  CO2 detector and Breath sounds checked- equal and bilateral Secured at: 24 cm Tube secured with: ETT holder Dental Injury: Teeth and Oropharynx as per pre-operative assessment     .Central Line  Date/Time: March 13, 2019 11:20 AM Performed by: Gerhard Munch, MD Authorized by: Gerhard Munch, MD   Consent:    Consent obtained:  Emergent situation   Consent given by:  Healthcare agent Universal protocol:    Procedure explained and questions answered to patient or proxy's satisfaction: no     Test results  available and properly labeled: yes     Imaging studies available: yes     Required blood products, implants, devices, and special equipment available: yes     Site/side marked: yes     Immediately prior to procedure, a time out was called: yes     Patient identity confirmed:  Arm band Pre-procedure details:    Hand hygiene: Hand hygiene performed prior to insertion     Sterile barrier technique: All elements of maximal sterile technique followed     Skin preparation:  Povidone-iodine   Skin preparation agent: Skin preparation agent completely dried prior to procedure   Anesthesia (see MAR for exact dosages):    Anesthesia method:  None Procedure details:    Location:  R femoral   Patient position:  Flat   Landmarks  identified: yes     Ultrasound guidance: yes     Sterile ultrasound techniques: Sterile gel and sterile probe covers were used     Number of attempts:  1   Successful placement: yes   Post-procedure details:    Post-procedure:  Dressing applied and line sutured   Assessment:  Blood return through all ports and free fluid flow   Patient tolerance of procedure:  Tolerated well, no immediate complications   CRITICAL CARE Performed by: Gerhard Munch Total critical care time: 35 minutes Critical care time was exclusive of separately billable procedures and treating other patients. Critical care was necessary to treat or prevent imminent or life-threatening deterioration. Critical care was time spent personally by me on the following activities: development of treatment plan with patient and/or surrogate as well as nursing, discussions with consultants, evaluation of patient's response to treatment, examination of patient, obtaining history from patient or surrogate, ordering and performing treatments and interventions, ordering and review of laboratory studies, ordering and review of radiographic studies, pulse oximetry and re-evaluation of patient's condition.    Medications Ordered in ED Medications  sodium chloride 0.9 % bolus 1,000 mL (0 mLs Intravenous Stopped Feb 28, 2019 1131)    And  0.9 %  sodium chloride infusion (has no administration in time range)  etomidate (AMIDATE) injection 20 mg (has no administration in time range)  rocuronium (ZEMURON) injection 100 mg (has no administration in time range)  norepinephrine (LEVOPHED) 4mg  in premix infusion (has no administration in time range)  etomidate (AMIDATE) injection (20 mg Intravenous Given 2019-02-28 1010)  rocuronium (ZEMURON) injection (100 mg Intravenous Given Feb 28, 2019 1011)    ED Course  I have reviewed the triage vital signs and the nursing notes.  Pertinent labs & imaging results that were available during my care of  the patient were reviewed by me and considered in my medical decision making (see chart for details).    MDM Rules/Calculators/A&P                      Immediately after arrival the patient was transferred to our monitoring equipment. With concern for airway protection given the patient's CPR, the patient had exchange of his [facility] airway for endotracheal tube. This was performed without complication. Subsequently, the patient continued to have declining blood pressure values in spite of IV fluids provided via IO. Patient required placement of a central line, this was performed without complication in the right femoral vein.   11:54 AM Patient required Levophed due to persistent hypotension. Patient's chart was merged with another record, it seems as though he has a history of COPD, but additional details are not available.  This adult male presents after being found unresponsive with initial rhythm of asystole or PEA Patient has a history of COPD, but other details are not immediately available. On arrival the patient was unresponsive, with minimal spontaneous respiratory effort, but no neurologic function. Patient required emergent placement of ET tube, subsequently central line for hemodynamic support, admission to the ICU for further monitoring, management.  12:37 PM family notes patient has been sick for least the past week, and during a physician visit earlier in the week he was reportedly hypoxic, with a saturation of 59% on room air.  He reportedly declined emergency evaluation at that point.  Final Clinical Impression(s) / ED Diagnoses Final diagnoses:  Cardiac arrest [facility])     Carmin Muskrat, MD 27-Feb-2019 1201    Carmin Muskrat, MD [id number]

## 2019-02-15 NOTE — H&P (Signed)
NAME:  Harwood Nall, MRN:  [id number], DOB:  18-Mar-1950, LOS: 0 ADMISSION DATE:  2019-02-18, CONSULTATION DATE:  2019-02-18 REFERRING MD: Jeraldine Loots  CHIEF COMPLAINT:  Cardiac arrest   Brief History   68 year old male out of hospital cardiac arrest, unclear if asystole or PEA, CPR for approximately 10 minutes with return of spontaneous circulation prehospital, unresponsive.  Intubated, central line placed in ER for hypotension.  PCCM requested to admit.  History of present illness   68 year old male with only known history of probable COPD followed by Dr. Sherene Sires, no PFTs on file, current smoker who presented to ER via EMS.  Reportedly the patient was in his "normal" state of health on 12/19, however family stated that the patient contacted them and they called EMS the patient's behalf today, unclear events of today leading up to that.  However per ER d/w family it was reported that patient was not feeling well for at least the past week and during a physician visit earlier this week he was reportedly hypoxic with saturation of 59% on room air.  He reportedly declined emergency evaluation at that time.  On EMS arrival the patient was unresponsive with asystole or PEA- unclear as to the shoulder rhythm.  He received CPR with return of spontaneous circulation after approximately 10 minutes and 2 rounds of 1 mg epinephrine.Brooke Dare airway was placed.  He was unresponsive in route.  In the ER he was intubated, central line placed due to hypotension.  Hypercarbic with PCO2 of 87.  LFTs elevated.  Lactic acid 5.5.  UDS negative.  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  Incidental finding of soft tissue density on the left external auditory canal with erosion of the adjacent temporal bone.  PCCM requested to admit patient.  Past Medical History  No past medical history on file.  COPD only reported history.   Significant Hospital Events   12/20  Admitted   Consults:  PCCM   Procedures:  12/20 ETI>> 12/20 R femoral CVC>>  Significant Diagnostic Tests:  12/20 CT Head  1. No acute intracranial abnormality. 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 2. Soft tissue density within the left external auditory canal with erosion of the adjacent temporal bone. Differential considerations include cholesteatoma or squamous cell carcinoma. Recommend ENT consultation for direct visualization.  Of note- CT chest 01/12/2019  1. Comparing to prior CT chest of 08/05/2018, the right middle and lower lobe chronic collapse/scarring is unchanged. 2. Small polypoid focus along a right upper lobe bronchus is new since prior and probably reflects mucus or secretions. Attention on follow-up recommended. 3. Expansile sclerotic and lucent lesion in the posterior left fourth rib is similar. 5 month interval stability is reassuring. Repeat CT chest in 6 months could be used to ensure continued stability. Nuclear medicine bone scan may prove helpful to assess for active bony turnover in this lesion. 4. Emphysema. (ICD10-J43.9) 5. Aortic Atherosclerois (ICD10-170.0)  Micro Data:  SARS Coronavirus 2 negative Influenza A&B negative    Antimicrobials:  NA  Interim history/subjective:  Evaluated in ER trauma bay C.  Patient has just received fentanyl for "bucking the vent."  Blood pressure had dropped and Levophed increased from 5 mics per minute to 20 mics per minute per bedside RN.  Objective   Blood pressure 103/80, pulse 83, temperature 98.3 F (36.8 C), temperature source Rectal, resp. rate (!) 24, height 5\' 9"  (1.753 m), SpO2 100 %.  Vent Mode: PRVC FiO2 (%):  [50 %] 50 % Set Rate:  [24 bmp] 24 bmp Vt Set:  [570 mL] 570 mL PEEP:  [5 cmH20] 5 cmH20 Plateau Pressure:  [14 cmH20] 14 cmH20  No intake or output data in the 24 hours ending 02/09/2019 1231 There were no vitals filed for this visit.  Examination: General:  Cachectic, mild chronically and acutely ill-appearing, well-developed African-American male HENT: Temporal wasting.  Normocephalic, PERRL-pin-point.  Arcus senilis.  Dry mucus membranes Neck: No JVD. Trachea midline.  CV: RRR. S1S2. No MRG. +2 distal pulses Lungs: BBS managed at bases, otherwise clear.  FNL, symmetrical.  Full vent support. ABD: +BS x4. SNT/ND. No masses, guarding or rigidity GU: No Foley EXT: Global muscle atrophy.  No edema Skin: Pale, cool, dry, scaly.  Poor turgor anterior surfaces intact. Neuro: Patient occasionally biting on endotracheal tube, otherwise unresponsive.   Resolved Hospital Problem list   NA  Assessment & Plan:  Audible PEA cardiac arrest likely primary respiratory arrest leading to cardiac arrest.  Right bundle branch block per EKG otherwise no ST elevation or depression. Plan: -Targeted temperature management for 36 C protocol -Echo -Telemetry, repeat echo in a.m., serial troponin -Vasopressors for map goal 65 or greater -EEG  -Place Foley for strict/I&O and urine output monitoring in critically ill patient -Serial lactic acid -Sedation and analgesia per TTM protocol -Continue ventilator support to prevent eminent deterioration and further organ dysfunction from hypoxemia and hypercarbia.   -Patient is at risk for sudden hypoxia, barotrauma and hemodynamic compromise.   -Maintain SpO2 greater than or equal to 90%. -Head of bed elevated 30 degrees. -Plateau pressures less than 30 cm H20.  -Follow chest x-ray, ABG.   -SAT/SBT as tolerated. -Bronchial hygiene. -RT/bronchodilator protocol. -Low suspicion for infection, will check procalcitonin for completeness antibiotics for now.  Elevated LFTs, likely due to cardiac arrest Plan: Follow with CMP  Hypokalemia Plan: Replace now and monitor TTM  Lung lesion per EMR review, seen on previous CT Will need outpatient follow-up pending recovery  Best practice:  Diet: NPO.  Consult dietitian  for tube feeds Pain/Anxiety/Delirium protocol (if indicated): Fentanyl and Versed VAP protocol (if indicated): Ordered DVT prophylaxis: Heparin subcu GI prophylaxis: PPI Glucose control: Sliding scale insulin Mobility: Bedrest Code Status: Full Family Communication: Attempted to contact patient's son Gonzalo, [place]., listed as contact, no answer Disposition: Admit ICU  Labs   CBC: Recent Labs  Lab 02/09/2019 1028 02/05/2019 1125  WBC  --  12.1*  NEUTROABS  --  10.9*  HGB 16.7 13.6  HCT 49.0 43.9  MCV  --  90.0  PLT  --  856    Basic Metabolic Panel: Recent Labs  Lab 02/20/2019 1028 02/04/2019 1125  NA 142 145  K 3.1* 3.2*  CL  --  101  CO2  --  27  GLUCOSE  --  160*  BUN  --  17  CREATININE  --  0.89  CALCIUM  --  8.2*   GFR: CrCl cannot be calculated (Unknown ideal weight.). Recent Labs  Lab 02/07/2019 1124 02/12/2019 1125  WBC  --  12.1*  LATICACIDVEN 5.5*  --     Liver Function Tests: Recent Labs  Lab 02/01/2019 1125  AST 269*  ALT 294*  ALKPHOS 50  BILITOT 1.0  PROT 5.6*  ALBUMIN 2.6*   No results for input(s): LIPASE, AMYLASE in the last 168 hours. No results for input(s): AMMONIA in the last 168 hours.  ABG  Component Value Date/Time   PHART 7.195 (LL) 02/07/2019 1028   PCO2ART 87.9 (HH) 02/07/2019 1028   PO2ART 384.0 (H) 02/19/2019 1028   HCO3 34.0 (H) 02/11/2019 1028   TCO2 37 (H) 02/14/2019 1028   O2SAT 100.0 02/12/2019 1028     Coagulation Profile: Recent Labs  Lab 02/17/2019 1125  INR 1.3*    Cardiac Enzymes: No results for input(s): CKTOTAL, CKMB, CKMBINDEX, TROPONINI in the last 168 hours.  HbA1C: No results found for: HGBA1C  CBG: Recent Labs  Lab 02/25/2019 1019  GLUCAP 101*    Review of Systems:   Unable to obtain complete review of systems due to patient unresponsive, intubated, unable to contact family   Past Medical History  Probable COPD Current smoker  Surgical History   No surgical history on file, unable to  locate family to review  Social History  Current smoker otherwise social history unknown  Family History   His family history is not on file.  Unable to obtain review with patient or family.  Allergies Penicillins, causes itching per EMR  Home Medications  No known medication    The patient is critically ill with respiratory failure after cardiac arrest. He requires ICU for high complexity decision making, titration of high alert medications, ventilator management, titration of oxygen and interpretation of advanced monitoring.    I personally spent 45 minutes providing critical care services including personally reviewing test results, discussing care with nursing staff/other physicians and completing orders pertaining to this patient.  Time was exclusive to the patient and does not include time spent teaching or in procedures.  Voice recognition software was used in the production of this record.  Errors in interpretation may have been inadvertently missed during review.  Karin Lieuhonda Susana Duell, MSN, AGACNP  [facility]

## 2019-02-15 NOTE — Progress Notes (Signed)
Pt transported from ED to 2H15 via ventilator with no complications noted. Lanelle Bal B RRT at bedside

## 2019-02-16 ENCOUNTER — Inpatient Hospital Stay (HOSPITAL_COMMUNITY): Payer: Medicare HMO

## 2019-02-16 DIAGNOSIS — I340 Nonrheumatic mitral (valve) insufficiency: Secondary | ICD-10-CM

## 2019-02-16 DIAGNOSIS — I5043 Acute on chronic combined systolic (congestive) and diastolic (congestive) heart failure: Secondary | ICD-10-CM

## 2019-02-16 DIAGNOSIS — J9601 Acute respiratory failure with hypoxia: Secondary | ICD-10-CM

## 2019-02-16 DIAGNOSIS — R4182 Altered mental status, unspecified: Secondary | ICD-10-CM

## 2019-02-16 LAB — POCT I-STAT, CHEM 8
BUN: 26 mg/dL — ABNORMAL HIGH (ref 8–23)
BUN: 32 mg/dL — ABNORMAL HIGH (ref 8–23)
Calcium, Ion: 1.26 mmol/L (ref 1.15–1.40)
Calcium, Ion: 1.18 mmol/L (ref 1.15–1.40)
Chloride: 100 mmol/L (ref 98–111)
Chloride: 102 mmol/L (ref 98–111)
Creatinine, Ser: 1.1 mg/dL (ref 0.61–1.24)
Creatinine, Ser: 1.2 mg/dL (ref 0.61–1.24)
Glucose, Bld: 108 mg/dL — ABNORMAL HIGH (ref 70–99)
Glucose, Bld: 88 mg/dL (ref 70–99)
HCT: 47 % (ref 39.0–52.0)
HCT: 42 % (ref 39.0–52.0)
Hemoglobin: 16 g/dL (ref 13.0–17.0)
Hemoglobin: 14.3 g/dL (ref 13.0–17.0)
Potassium: 5.1 mmol/L (ref 3.5–5.1)
Potassium: 3.5 mmol/L (ref 3.5–5.1)
Sodium: 143 mmol/L (ref 135–145)
Sodium: 142 mmol/L (ref 135–145)
TCO2: 35 mmol/L — ABNORMAL HIGH (ref 22–32)
TCO2: 28 mmol/L (ref 22–32)

## 2019-02-16 LAB — BASIC METABOLIC PANEL
Anion gap: 16 — ABNORMAL HIGH (ref 5–15)
Anion gap: 13 (ref 5–15)
BUN: 27 mg/dL — ABNORMAL HIGH (ref 8–23)
BUN: 36 mg/dL — ABNORMAL HIGH (ref 8–23)
CO2: 28 mmol/L (ref 22–32)
CO2: 26 mmol/L (ref 22–32)
Calcium: 8.7 mg/dL — ABNORMAL LOW (ref 8.9–10.3)
Calcium: 8.5 mg/dL — ABNORMAL LOW (ref 8.9–10.3)
Chloride: 99 mmol/L (ref 98–111)
Chloride: 103 mmol/L (ref 98–111)
Creatinine, Ser: 1.27 mg/dL — ABNORMAL HIGH (ref 0.61–1.24)
Creatinine, Ser: 1.42 mg/dL — ABNORMAL HIGH (ref 0.61–1.24)
GFR calc Af Amer: >60 mL/min (ref >60)
GFR calc Af Amer: 59 mL/min — ABNORMAL LOW (ref >60)
GFR calc non Af Amer: 58 mL/min — ABNORMAL LOW (ref >60)
GFR calc non Af Amer: 51 mL/min — ABNORMAL LOW (ref >60)
Glucose, Bld: 108 mg/dL — ABNORMAL HIGH (ref 70–99)
Glucose, Bld: 90 mg/dL (ref 70–99)
Potassium: 4.1 mmol/L (ref 3.5–5.1)
Potassium: 3.3 mmol/L — ABNORMAL LOW (ref 3.5–5.1)
Sodium: 143 mmol/L (ref 135–145)
Sodium: 142 mmol/L (ref 135–145)

## 2019-02-16 LAB — ECHOCARDIOGRAM COMPLETE
Height: 69 in
Weight: 3200 oz

## 2019-02-16 LAB — PROTIME-INR
INR: 1.2 (ref 0.8–1.2)
Prothrombin Time: 14.9 seconds (ref 11.4–15.2)

## 2019-02-16 LAB — CBC
HCT: 46.8 % (ref 39.0–52.0)
Hemoglobin: 14.6 g/dL (ref 13.0–17.0)
MCH: 27.6 pg (ref 26.0–34.0)
MCHC: 31.2 g/dL (ref 30.0–36.0)
MCV: 88.5 fL (ref 80.0–100.0)
Platelets: 245 10*3/uL (ref 150–400)
RBC: 5.29 MIL/uL (ref 4.22–5.81)
RDW: 13.6 % (ref 11.5–15.5)
WBC: 18.4 10*3/uL — ABNORMAL HIGH (ref 4.0–10.5)
nRBC: 0 % (ref 0.0–0.2)

## 2019-02-16 LAB — COMPREHENSIVE METABOLIC PANEL
ALT: 268 U/L — ABNORMAL HIGH (ref 0–44)
AST: 158 U/L — ABNORMAL HIGH (ref 15–41)
Albumin: 3.1 g/dL — ABNORMAL LOW (ref 3.5–5.0)
Alkaline Phosphatase: 56 U/L (ref 38–126)
Anion gap: 14 (ref 5–15)
BUN: 27 mg/dL — ABNORMAL HIGH (ref 8–23)
CO2: 27 mmol/L (ref 22–32)
Calcium: 9 mg/dL (ref 8.9–10.3)
Chloride: 103 mmol/L (ref 98–111)
Creatinine, Ser: 1.25 mg/dL — ABNORMAL HIGH (ref 0.61–1.24)
GFR calc Af Amer: >60 mL/min (ref >60)
GFR calc non Af Amer: 60 mL/min — ABNORMAL LOW (ref >60)
Glucose, Bld: 111 mg/dL — ABNORMAL HIGH (ref 70–99)
Potassium: 3.7 mmol/L (ref 3.5–5.1)
Sodium: 144 mmol/L (ref 135–145)
Total Bilirubin: 0.7 mg/dL (ref 0.3–1.2)
Total Protein: 6.3 g/dL — ABNORMAL LOW (ref 6.5–8.1)

## 2019-02-16 LAB — GLUCOSE, CAPILLARY
Glucose-Capillary: 107 mg/dL — ABNORMAL HIGH (ref 70–99)
Glucose-Capillary: 71 mg/dL (ref 70–99)
Glucose-Capillary: 73 mg/dL (ref 70–99)
Glucose-Capillary: 84 mg/dL (ref 70–99)
Glucose-Capillary: 87 mg/dL (ref 70–99)
Glucose-Capillary: 93 mg/dL (ref 70–99)
Glucose-Capillary: 96 mg/dL (ref 70–99)
Glucose-Capillary: 99 mg/dL (ref 70–99)

## 2019-02-16 LAB — LACTIC ACID, PLASMA: Lactic Acid, Venous: 2.3 mmol/L (ref 0.5–1.9)

## 2019-02-16 LAB — URINE CULTURE: Culture: NO GROWTH

## 2019-02-16 LAB — PHOSPHORUS: Phosphorus: 4.5 mg/dL (ref 2.5–4.6)

## 2019-02-16 LAB — TROPONIN I (HIGH SENSITIVITY): Troponin I (High Sensitivity): 502 ng/L (ref <18)

## 2019-02-16 LAB — TRIGLYCERIDES: Triglycerides: 138 mg/dL (ref <150)

## 2019-02-16 LAB — MAGNESIUM
Magnesium: 1.8 mg/dL (ref 1.7–2.4)
Magnesium: 2.3 mg/dL (ref 1.7–2.4)

## 2019-02-16 LAB — BRAIN NATRIURETIC PEPTIDE: B Natriuretic Peptide: 363 pg/mL — ABNORMAL HIGH (ref 0.0–100.0)

## 2019-02-16 MED ORDER — NOREPINEPHRINE 16 MG/250ML-% IV SOLN
0.0000 ug/min | INTRAVENOUS | Status: DC
  Administered 2019-02-16: 18 ug/min via INTRAVENOUS
  Administered 2019-02-16: 18:00:00 8 ug/min via INTRAVENOUS
  Filled 2019-02-16 (×2): qty 250

## 2019-02-16 MED ORDER — VITAL 1.5 CAL PO LIQD
1000.0000 mL | ORAL | Status: DC
  Administered 2019-02-16: 1000 mL
  Filled 2019-02-16 (×3): qty 1000

## 2019-02-16 MED ORDER — PRO-STAT SUGAR FREE PO LIQD
30.0000 mL | Freq: Three times a day (TID) | ORAL | Status: DC
  Administered 2019-02-16 – 2019-02-22 (×18): 30 mL
  Filled 2019-02-16 (×17): qty 30

## 2019-02-16 MED ORDER — DEXTROSE 50 % IV SOLN
INTRAVENOUS | Status: AC
  Filled 2019-02-16: qty 50

## 2019-02-16 MED ORDER — MAGNESIUM SULFATE 2 GM/50ML IV SOLN
2.0000 g | Freq: Once | INTRAVENOUS | Status: AC
  Administered 2019-02-16: 13:00:00 2 g via INTRAVENOUS
  Filled 2019-02-16: qty 50

## 2019-02-16 NOTE — Progress Notes (Signed)
Initial Nutrition Assessment  DOCUMENTATION CODES:   Not applicable  INTERVENTION:   Initiate Vital 1.5 @ 45 ml/hr (1080 ml/day) via OG tube 30 ml Prostat TID  Provides: 1920 kcal, 117 grams protein, and 825 ml free water.  TF regimen and propofol at current rate providing 2131 total kcal/day    NUTRITION DIAGNOSIS:   Inadequate oral intake related to inability to eat as evidenced by NPO status.  GOAL:   Patient will meet greater than or equal to 90% of their needs  MONITOR:   TF tolerance, I & O's  REASON FOR ASSESSMENT:   Consult, Ventilator Enteral/tube feeding initiation and management  ASSESSMENT:   Pt with PMH of COPD admitted with OOH arrest with ROSC at 10 minutes. Normotherapy initiated.   Pt had possible seizure overnight, currently on cEEG. AKI with reduced urine output.   Patient is currently intubated on ventilator support MV: 10.8 L/min Temp (24hrs), Avg:97.4 F (36.3 C), Min:96.1 F (35.6 C), Max:101.1 F (38.4 C)  Propofol: 8 ml/hr (15 mcg) provides: 211 kcal  Medications reviewed and include:  Levo @ 10 - MAP 81 Labs reviewed    NUTRITION - FOCUSED PHYSICAL EXAM:  Deferred  Diet Order:   Diet Order            Diet NPO time specified  Diet effective now              EDUCATION NEEDS:   No education needs have been identified at this time  Skin:  Skin Assessment: Reviewed RN Assessment  Last BM:  unknown  Height:   Ht Readings from Last 1 Encounters:  02-18-19 5\' 9"  (1.753 m)    Weight:   Wt Readings from Last 1 Encounters:  02-18-19 90.7 kg    Ideal Body Weight:     BMI:  Body mass index is 29.53 kg/m.  Estimated Nutritional Needs:   Kcal:  2144  Protein:  110-125 grams  Fluid:  >2 L/day  Maylon Peppers RD, LDN, CNSC [phone] Pager [phone] After Hours Pager

## 2019-02-16 NOTE — Progress Notes (Signed)
eLink notified of pts. decreased urine output, arrhythmias, and posturing. Will continue to monitor.

## 2019-02-16 NOTE — Progress Notes (Signed)
EEG Completed; Results Pending  

## 2019-02-16 NOTE — Progress Notes (Signed)
  Echocardiogram 2D Echocardiogram has been performed.  Ruben Anderson 02/16/2019, 9:05 AM

## 2019-02-16 NOTE — Progress Notes (Signed)
Low urine output over 8 hours, 70 ml total. Paged Dr. Ruthann Cancer. Will continue to monitor.

## 2019-02-16 NOTE — Progress Notes (Signed)
Daughter [facility]) called for updates regarding patients condition. I notified her of patient being cooled and will remain intubated and sedated during the process.

## 2019-02-16 NOTE — Progress Notes (Signed)
NAME:  Ruben Anderson, MRN:  [id number], DOB:  08/20/1950, LOS: 1 ADMISSION DATE:  2019-02-27, CONSULTATION DATE:  02-27-19 REFERRING MD: Vanita Panda  CHIEF COMPLAINT:  Cardiac arrest   Brief History   68 year old male out of hospital cardiac arrest, unclear if asystole or PEA, CPR for approximately 10 minutes with return of spontaneous circulation prehospital, unresponsive.  Intubated, central line placed in ER for hypotension.  PCCM requested to admit.  History of present illness   68 year old male with only known history of probable COPD followed by Dr. Melvyn Novas, no PFTs on file, current smoker who presented to ER via EMS.  Reportedly the patient was in his "normal" state of health on 12/19, however family stated that the patient contacted them and they called EMS the patient's behalf today, unclear events of today leading up to that.  However per ER d/w family it was reported that patient was not feeling well for at least the past week and during a physician visit earlier this week he was reportedly hypoxic with saturation of 59% on room air.  He reportedly declined emergency evaluation at that time.  On EMS arrival the patient was unresponsive with asystole or PEA- unclear as to the shoulder rhythm.  He received CPR with return of spontaneous circulation after approximately 10 minutes and 2 rounds of 1 mg epinephrine.[name] airway was placed.  He was unresponsive in route.  In the ER he was intubated, central line placed due to hypotension.  Hypercarbic with PCO2 of 87.  LFTs elevated.  Lactic acid 5.5.  UDS negative.  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  Incidental finding of soft tissue density on the left external auditory canal with erosion of the adjacent temporal bone.  PCCM requested to admit patient.  Past Medical History  No past medical history on file.  COPD only reported history.   Significant Hospital Events   12/20  Admitted   Consults:  PCCM   Procedures:  12/20 ETT>> 12/20 R femoral CVC>> R radial art line 12/20 >   Significant Diagnostic Tests:  12/20 CT Head  > neg for acute intracranial process. Soft tissue density in left external auditory canal with erosion of the adjacent left temporal bone.  Recommend EENT consultation. 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 EEG 12/21 >   Micro Data:  SARS Coronavirus 2 negative Influenza A&B negative    Antimicrobials:  NA  Interim history/subjective:  Reached goal temp around 7pm 12/20. Had questionable seizure like activity overnight, getting cEEG now.  Objective   Blood pressure 97/69, pulse (!) 53, temperature (!) 96.8 F (36 C), temperature source Bladder, resp. rate 20, height 5\' 9"  (1.753 m), weight 90.7 kg, SpO2 100 %. CVP:  [6 mmHg] 6 mmHg  Vent Mode: PRVC FiO2 (%):  [50 %] 50 % Set Rate:  [20 bmp-24 bmp] 20 bmp Vt Set:  [570 mL] 570 mL PEEP:  [5 cmH20] 5 cmH20 Plateau Pressure:  [18 cmH20-21 cmH20] 18 cmH20   Intake/Output Summary (Last 24 hours) at 02/16/2019 1110 Last data filed at 02/16/2019 1000 Gross per 24 hour  Intake 2873.5 ml  Output 200 ml  Net 2673.5 ml   Filed Weights   2019/02/27 2323  Weight: 90.7 kg    Examination: General: Adult male, resting in bed, in NAD. Neuro: Sedated, not responsive. HEENT: [place]/AT. Sclerae anicteric. ETT in place. Cardiovascular: RRR,  no M/R/G.  Lungs: Respirations even and unlabored.  CTA bilaterally, No W/R/R.   Assessment & Plan:   PEA cardiac arrest likely primary respiratory arrest leading to cardiac arrest.  Right bundle branch block per EKG otherwise no sig findings. - Targeted temperature management for 36 C protocol. - F/u on echo, EEG. - Levophed for MAP goal > 70 while cooling.  Acute hypoxic respiratory failure - 2/2 above.  S/p intubation. - Continue full vent support. - Hold weaning  until TTM complete. - Bronchial hygiene. - Follow CXR.  ? 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 - Cards consult for ongoing care / follow up.  AKI - 2/2 above. - Supportive care. - Follow BMP.  Hypomagnesemia. - 2g mag.  Elevated LFTs - 2/2 above.  Improving. - Follow LFT's.  Possible UTI - doubtful. - Continue ceftriaxone for now but low threshold to d/c.  Lung lesion per EMR review, seen on previous CT. - Will need outpatient follow-up pending recovery  Left external auditory canal soft tissue density with erosion of adjacent temporal bone. - Needs ENT evaluation once over acute issues.  Best practice:  Diet: TF's. Pain/Anxiety/Delirium protocol (if indicated): Propofol gtt / Fentanyl gtt / Midazolam PRN.  RASS goal -1. VAP protocol (if indicated): In place. DVT prophylaxis: Heparin subcu GI prophylaxis: PPI Glucose control: Sliding scale insulin Mobility: Bedrest Code Status: Full Family Communication: On admission, attempted to contact patient's son Cobe Viney, Montez Hageman., listed as contact, no answer.  Will attempt again today. Disposition: ICU  CC time: 40 min.   Rutherford Guys, [place] [facility] 02/16/2019, 11:23 AM

## 2019-02-16 NOTE — Procedures (Signed)
Patient Name: Ruben Anderson  MRN: [id number]  Epilepsy Attending: Lora Havens  Referring Physician/Provider: Francine Graven, NP Date: 02/16/2019 Duration: 22.57 minutes  Patient history: 68 year old male status post cardiac arrest on TTM.  Evaluate for seizures.  Level of alertness: Comatose  AEDs during EEG study: Propofol  Technical aspects: This EEG study was done with scalp electrodes positioned according to the 10-20 International system of electrode placement. Electrical activity was acquired at a sampling rate of 500Hz  and reviewed with a high frequency filter of 70Hz  and a low frequency filter of 1Hz . EEG data were recorded continuously and digitally stored.   Description: EEG was technically difficult due to significant myogenic artifact.  Continuous generalized low voltage 2 to 3 Hz delta slowing was noted.  EEG was reactive to noxious stimulation. Hyperventilation and photic stimulation were not performed.  Abnormalities -continuous slow, generalized    IMPRESSION: This technically difficult study is suggestive of profound diffuse encephalopathy, nonspecific etiology. No definite seizures or epileptiform discharges were seen throughout the recording.  Ruben Anderson Ruben Anderson

## 2019-02-16 NOTE — Progress Notes (Signed)
LTM started; educated nurse on event button. 

## 2019-02-16 NOTE — Procedures (Signed)
Arterial Catheter Insertion Procedure Note Ruben Anderson [id number] Jul 09, 1950  Procedure: Insertion of Arterial Catheter  Indications: Blood pressure monitoring  Procedure Details Consent: Unable to obtain consent because of altered level of consciousness. Time Out: Verified patient identification, verified procedure, site/side was marked, verified correct patient position, special equipment/implants available, medications/allergies/relevent history reviewed, required imaging and test results available.  Performed  Maximum sterile technique was used including antiseptics, gloves, gown, hand hygiene, mask and sheet. Skin prep: Chlorhexidine; local anesthetic administered 20 gauge catheter was inserted into right radial artery using the Seldinger technique. ULTRASOUND GUIDANCE USED: YES Evaluation Blood flow good; BP tracing good. Complications: No apparent complications.   Ruben Anderson Ruben Anderson 02/16/2019

## 2019-02-17 LAB — GLUCOSE, CAPILLARY
Glucose-Capillary: 104 mg/dL — ABNORMAL HIGH (ref 70–99)
Glucose-Capillary: 70 mg/dL (ref 70–99)
Glucose-Capillary: 63 mg/dL — ABNORMAL LOW (ref 70–99)
Glucose-Capillary: 70 mg/dL (ref 70–99)
Glucose-Capillary: 55 mg/dL — ABNORMAL LOW (ref 70–99)
Glucose-Capillary: 88 mg/dL (ref 70–99)
Glucose-Capillary: 86 mg/dL (ref 70–99)

## 2019-02-17 LAB — BASIC METABOLIC PANEL
Anion gap: 15 (ref 5–15)
BUN: 38 mg/dL — ABNORMAL HIGH (ref 8–23)
CO2: 26 mmol/L (ref 22–32)
Calcium: 8.6 mg/dL — ABNORMAL LOW (ref 8.9–10.3)
Chloride: 102 mmol/L (ref 98–111)
Creatinine, Ser: 1.33 mg/dL — ABNORMAL HIGH (ref 0.61–1.24)
GFR calc Af Amer: >60 mL/min (ref >60)
GFR calc non Af Amer: 55 mL/min — ABNORMAL LOW (ref >60)
Glucose, Bld: 109 mg/dL — ABNORMAL HIGH (ref 70–99)
Potassium: 3.2 mmol/L — ABNORMAL LOW (ref 3.5–5.1)
Sodium: 143 mmol/L (ref 135–145)

## 2019-02-17 LAB — CBC
HCT: 40.9 % (ref 39.0–52.0)
Hemoglobin: 13 g/dL (ref 13.0–17.0)
MCH: 27.7 pg (ref 26.0–34.0)
MCHC: 31.8 g/dL (ref 30.0–36.0)
MCV: 87.2 fL (ref 80.0–100.0)
Platelets: 177 10*3/uL (ref 150–400)
RBC: 4.69 MIL/uL (ref 4.22–5.81)
RDW: 13.9 % (ref 11.5–15.5)
WBC: 14.9 10*3/uL — ABNORMAL HIGH (ref 4.0–10.5)
nRBC: 0 % (ref 0.0–0.2)

## 2019-02-17 LAB — PHOSPHORUS
Phosphorus: 4.2 mg/dL (ref 2.5–4.6)
Phosphorus: 3.2 mg/dL (ref 2.5–4.6)

## 2019-02-17 LAB — MAGNESIUM
Magnesium: 2.1 mg/dL (ref 1.7–2.4)
Magnesium: 2.1 mg/dL (ref 1.7–2.4)

## 2019-02-17 MED ORDER — DEXTROSE 50 % IV SOLN
INTRAVENOUS | Status: AC
  Administered 2019-02-17: 21:00:00 12.5 g via INTRAVENOUS
  Filled 2019-02-17: qty 50

## 2019-02-17 MED ORDER — ARTIFICIAL TEARS OPHTHALMIC OINT
TOPICAL_OINTMENT | Freq: Three times a day (TID) | OPHTHALMIC | Status: DC
  Administered 2019-02-17 – 2019-02-21 (×5): 1 via OPHTHALMIC
  Filled 2019-02-17 (×2): qty 3.5

## 2019-02-17 MED ORDER — DEXTROSE 50 % IV SOLN: 12.5000 g | INTRAVENOUS | Status: AC

## 2019-02-17 MED ORDER — VITAL 1.5 CAL PO LIQD
1000.0000 mL | ORAL | Status: DC
  Administered 2019-02-17 – 2019-02-18 (×2): 1000 mL
  Filled 2019-02-17: qty 1000

## 2019-02-17 MED ORDER — POTASSIUM CHLORIDE 10 MEQ/50ML IV SOLN
10.0000 meq | INTRAVENOUS | Status: AC
  Administered 2019-02-17 (×4): 10 meq via INTRAVENOUS
  Filled 2019-02-17 (×4): qty 50

## 2019-02-17 NOTE — Progress Notes (Signed)
eLink Physician-Brief Progress Note Patient Name: Ruben Anderson DOB: Jun 18, 1950 MRN: [id number]   Date of Service  02/17/2019  HPI/Events of Note  Multiple issues: 1. K+ = 3.3 and Creatinine = 1.33 and 2. Request for Lacrilube.   eICU Interventions  Will order: 1. Replace K+. 2. Lacrilube to both eyes now and Q 8 hours.      Intervention Category Major Interventions: Electrolyte abnormality - evaluation and management  [name] 02/17/2019, 4:38 AM

## 2019-02-17 NOTE — Progress Notes (Signed)
LTM maint complete - no skin breakdown under: YV8,PF2,T2,K4

## 2019-02-17 NOTE — Progress Notes (Signed)
NAME:  Ruben Anderson, MRN:  [id number], DOB:  11/07/1950, LOS: 2 ADMISSION DATE:  02/25/2019, CONSULTATION DATE:  02/14/2019 REFERRING MD: Vanita Panda  CHIEF COMPLAINT:  Cardiac arrest   Brief History   68 year old male out of hospital cardiac arrest, unclear if asystole or PEA, CPR for approximately 10 minutes with return of spontaneous circulation prehospital, unresponsive.  Intubated, central line placed in ER for hypotension.  PCCM requested to admit.  History of present illness   68 year old male with only known history of probable COPD followed by Dr. Melvyn Novas, no PFTs on file, current smoker who presented to ER via EMS.  Reportedly the patient was in his "normal" state of health on 12/19, however family stated that the patient contacted them and they called EMS the patient's behalf today, unclear events of today leading up to that.  However per ER d/w family it was reported that patient was not feeling well for at least the past week and during a physician visit earlier this week he was reportedly hypoxic with saturation of 59% on room air.  He reportedly declined emergency evaluation at that time.  On EMS arrival the patient was unresponsive with asystole or PEA- unclear as to the shoulder rhythm.  He received CPR with return of spontaneous circulation after approximately 10 minutes and 2 rounds of 1 mg epinephrine.[name] airway was placed.  He was unresponsive in route.  In the ER he was intubated, central line placed due to hypotension.  Hypercarbic with PCO2 of 87.  LFTs elevated.  Lactic acid 5.5.  UDS negative.  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  Incidental finding of soft tissue density on the left external auditory canal with erosion of the adjacent temporal bone.  PCCM requested to admit patient.  Past Medical History  No past medical history on file.  COPD only reported history.   Significant Hospital Events   12/20  Admitted   Consults:  PCCM   Procedures:  12/20 ETT>> 12/20 R femoral CVC>> R radial art line 12/20 >   Significant Diagnostic Tests:  12/20 CT Head  > neg for acute intracranial process. Soft tissue density in left external auditory canal with erosion of the adjacent left temporal bone.  Recommend EENT consultation. 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 EEG 12/21 > no definite seizure.  Technically difficult study suggestive of profound diffuse encephalopathic changes.  Micro Data:  SARS Coronavirus 2 negative Influenza A&B negative    Antimicrobials:  NA  Interim history/subjective:    Objective   Blood pressure 113/64, pulse 64, temperature 98.6 F (37 C), resp. rate 20, height 5\' 9"  (1.753 m), weight 90.7 kg, SpO2 99 %. CVP:  [5 mmHg-15 mmHg] 14 mmHg  Vent Mode: PRVC FiO2 (%):  [40 %] 40 % Set Rate:  [20 bmp] 20 bmp Vt Set:  [570 mL] 570 mL PEEP:  [5 cmH20] 5 cmH20 Plateau Pressure:  [14 cmH20-19 cmH20] 14 cmH20   Intake/Output Summary (Last 24 hours) at 02/17/2019 2025 Last data filed at 02/17/2019 0600 Gross per 24 hour  Intake 3302.65 ml  Output 600 ml  Net 2702.65 ml   Filed Weights   02/04/2019 2323  Weight: 90.7 kg    Examination: General: Frail elderly male sedated on full mechanical ventilatory support HEENT: Endotracheal tube gastric tube in place Neuro: Currently sedated does not follow commands CV: Heart sounds are  distant PULM: Coarse rhonchi bilaterally Vent pressure regulated volume control FIO2 40% PEEP 5 RATE 20 with no overbreathing VT 570 GI: soft, bsx4 active  Extremities: 1+ edema Skin: no rashes or lesions   Assessment & Plan:   PEA cardiac arrest likely primary respiratory arrest leading to cardiac arrest.  Right bundle branch block per EKG otherwise no sig findings. Continue targeted temperature management Pressors as needed.   Acute respiratory  failure endotracheal intubation in the setting of severe underlying emphysema Wean per protocol   ? 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 Consider cardiology consult  AKI  Lab Results  Component Value Date   CREATININE 1.33 (H) 02/17/2019   CREATININE 1.42 (H) 02/16/2019   CREATININE 1.20 02/16/2019   Recent Labs  Lab 02/16/19 1218 02/16/19 2135 02/17/19 0315  K 3.5 3.3* 3.2*    Monitor creatinine Replete electrolytes as needed  Elevated LFTs  Monitor LFTs  Possible UTI - doubtful. Currently on ceftriaxone but low threshold to discontinue  Lung lesion per EMR review, seen on previous CT. We will need outpatient follow-up in the future  Left external auditory canal soft tissue density with erosion of adjacent temporal bone. ENT consult in the future  Best practice:  Diet: TF's.  Currently on hold for vomiting will start back to trickle Pain/Anxiety/Delirium protocol (if indicated): Propofol gtt / Fentanyl gtt / Midazolam PRN.  RASS goal -1. VAP protocol (if indicated): In place. DVT prophylaxis: Heparin subcu GI prophylaxis: PPI Glucose control: Sliding scale insulin Mobility: Bedrest Code Status: Full Family Communication: On admission, attempted to contact patient's son Jamin Panther, Montez Hageman., listed as contact, no answer.  02/17/2019 attempted to call Susanne Greenhouse, Montez Hageman. at [phone].  Left a message for him to call the hospital. Disposition: ICU  App CC time: 30 min.   Brett Canales Maui Britten ACNP Acute Care Nurse Practitioner [facility] Please consult Amion 02/17/2019, 9:17 AM

## 2019-02-17 NOTE — Procedures (Addendum)
Patient Name: Ruben Anderson  MRN: [id number]  Epilepsy Attending: Lora Havens  Referring Physician/Provider: Francine Graven, NP Duration:  02/16/2019 1153 to 02/17/2019 1153  Patient history: 68 year old male status post cardiac arrest on TTM.  Evaluate for seizures.  Level of alertness: Comatose  AEDs during EEG study: Propofol  Technical aspects: This EEG study was done with scalp electrodes positioned according to the 10-20 International system of electrode placement. Electrical activity was acquired at a sampling rate of 500Hz  and reviewed with a high frequency filter of 70Hz  and a low frequency filter of 1Hz . EEG data were recorded continuously and digitally stored.   Description: EEG was technically difficult due to significant myogenic artifact.  Continuous generalized low voltage 2 to 3 Hz delta slowing was noted.  EEG was reactive to noxious stimulation. Hyperventilation and photic stimulation were not performed.  Abnormalities -Continuous slow, generalized   IMPRESSION: This technically difficult study is suggestive of profound diffuse encephalopathy, nonspecific etiology. No definite seizures or epileptiform discharges were seen throughout the recording.  EEG appears similar to previous day.  Wilborn Membreno Barbra Sarks

## 2019-02-18 ENCOUNTER — Inpatient Hospital Stay (HOSPITAL_COMMUNITY): Payer: Medicare HMO

## 2019-02-18 LAB — GLUCOSE, CAPILLARY
Glucose-Capillary: 87 mg/dL (ref 70–99)
Glucose-Capillary: 67 mg/dL — ABNORMAL LOW (ref 70–99)
Glucose-Capillary: 93 mg/dL (ref 70–99)
Glucose-Capillary: 80 mg/dL (ref 70–99)
Glucose-Capillary: 102 mg/dL — ABNORMAL HIGH (ref 70–99)
Glucose-Capillary: 112 mg/dL — ABNORMAL HIGH (ref 70–99)

## 2019-02-18 LAB — BASIC METABOLIC PANEL
Anion gap: 8 (ref 5–15)
BUN: 27 mg/dL — ABNORMAL HIGH (ref 8–23)
CO2: 29 mmol/L (ref 22–32)
Calcium: 8.5 mg/dL — ABNORMAL LOW (ref 8.9–10.3)
Chloride: 108 mmol/L (ref 98–111)
Creatinine, Ser: 0.81 mg/dL (ref 0.61–1.24)
GFR calc Af Amer: >60 mL/min (ref >60)
GFR calc non Af Amer: >60 mL/min (ref >60)
Glucose, Bld: 96 mg/dL (ref 70–99)
Potassium: 3.5 mmol/L (ref 3.5–5.1)
Sodium: 145 mmol/L (ref 135–145)

## 2019-02-18 LAB — CBC
HCT: 39.6 % (ref 39.0–52.0)
Hemoglobin: 12.6 g/dL — ABNORMAL LOW (ref 13.0–17.0)
MCH: 27.9 pg (ref 26.0–34.0)
MCHC: 31.8 g/dL (ref 30.0–36.0)
MCV: 87.8 fL (ref 80.0–100.0)
Platelets: 146 10*3/uL — ABNORMAL LOW (ref 150–400)
RBC: 4.51 MIL/uL (ref 4.22–5.81)
RDW: 14 % (ref 11.5–15.5)
WBC: 11.2 10*3/uL — ABNORMAL HIGH (ref 4.0–10.5)
nRBC: 0 % (ref 0.0–0.2)

## 2019-02-18 LAB — URINALYSIS, ROUTINE W REFLEX MICROSCOPIC
Bilirubin Urine: NEGATIVE
Glucose, UA: NEGATIVE mg/dL
Ketones, ur: NEGATIVE mg/dL
Nitrite: NEGATIVE
Protein, ur: 30 mg/dL — AB
Specific Gravity, Urine: 1.026 (ref 1.005–1.030)
pH: 5 (ref 5.0–8.0)

## 2019-02-18 LAB — MAGNESIUM: Magnesium: 2.1 mg/dL (ref 1.7–2.4)

## 2019-02-18 LAB — PHOSPHORUS: Phosphorus: 2.5 mg/dL (ref 2.5–4.6)

## 2019-02-18 LAB — TRIGLYCERIDES: Triglycerides: 89 mg/dL (ref <150)

## 2019-02-18 MED ORDER — PANTOPRAZOLE SODIUM 40 MG PO PACK
40.0000 mg | PACK | Freq: Every day | ORAL | Status: DC
  Administered 2019-02-18 – 2019-02-21 (×4): 40 mg
  Filled 2019-02-18 (×4): qty 20

## 2019-02-18 MED ORDER — DEXTROSE 50 % IV SOLN
INTRAVENOUS | Status: AC
  Filled 2019-02-18: qty 50

## 2019-02-18 MED ORDER — VITAL 1.5 CAL PO LIQD
1000.0000 mL | ORAL | Status: DC
  Administered 2019-02-19 – 2019-02-22 (×4): 1000 mL
  Filled 2019-02-18 (×5): qty 1000

## 2019-02-18 MED ORDER — POTASSIUM CHLORIDE 20 MEQ/15ML (10%) PO SOLN
20.0000 meq | ORAL | Status: AC
  Administered 2019-02-18 (×2): 20 meq
  Filled 2019-02-18 (×2): qty 15

## 2019-02-18 MED ORDER — DEXTROSE 50 % IV SOLN
12.5000 g | INTRAVENOUS | Status: AC
  Administered 2019-02-18: 10:00:00 12.5 g via INTRAVENOUS

## 2019-02-18 NOTE — Progress Notes (Signed)
RT NOTE: RT transported patient on ventilator from room 2H15 to MRI and back to room 2H15 with no apparent complications. Vitals are stable. RT will continue to monitor.
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
## 2019-02-18 NOTE — Progress Notes (Signed)
[facility] ADULT ICU REPLACEMENT PROTOCOL FOR AM LAB REPLACEMENT ONLY  The patient does apply for the [facility] Adult ICU Electrolyte Replacment Protocol based on the criteria listed below:   1. Is GFR >/= 40 ml/min? Yes.    Patient's GFR today is >60 2. Is urine output >/= 0.5 ml/kg/hr for the last 6 hours? Yes.   Patient's UOP is .58 ml/kg/hr 3. Is BUN < 60 mg/dL? Yes.    Patient's BUN today is 27 4. Abnormal electrolyte(s): K-3.5 5. Ordered repletion with: per protocol 6. If a panic level lab has been reported, has the CCM MD in charge been notified? Yes.  .   Physician:  Dr Terrill Mohr, Philis Nettle 02/18/2019 6:31 AM

## 2019-02-18 NOTE — Progress Notes (Signed)
LTM discontinued; skin breakdown at C3; nurse notified.

## 2019-02-18 NOTE — Progress Notes (Signed)
Was on note

## 2019-02-18 NOTE — Progress Notes (Signed)
Dc'd a-line and foley per ccm orders.
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
## 2019-02-18 NOTE — Procedures (Addendum)
Patient Name:Phyllis Eifler GGE:[id number] Epilepsy Attending:Tavyn Kurka Barbra Sarks Referring Physician/Provider:Rhonda Joya Gaskins, NP Duration: 02/17/2019 1153 to 02/18/2019 1057  Patient history:68 year old male status post cardiac arrest on TTM. Evaluate for seizures.  Level of alertness:Awake, asleep  AEDs during EEG study:None  Technical aspects: This EEG study was done with scalp electrodes positioned according to the 10-20 International system of electrode placement. Electrical activity was acquired at a sampling rate of 500Hz  and reviewed with a high frequency filter of 70Hz  and a low frequency filter of 1Hz . EEG data were recorded continuously and digitally stored.  Description: During awake state, no clear posterior dominant rhythm was seen. EEG showedcontinuous generalized low voltage 2 to 5 Hz theta-delta slowing was noted. EEG was reactive to verbal stimulation. Hyperventilation and photic stimulation were not performed.  Event button was pressed on 02/18/2019 at 0039 for unclear reasons.  Concomitant EEG before during and after the event showed continuous generalized 2 to 5 Hz theta-delta slowing.  Abnormalities -Continuous slow, generalized  IMPRESSION: Thisstudy is suggestive of severe diffuse encephalopathy, nonspecific etiology.Nodefiniteseizures or epileptiform discharges were seen throughout the recording.  Event button was pushed on 02/18/2019 at 0039 for unclear reasons without concomitant EEG change and was therefore not an epileptic event.  EEG appears to be improving compared to previous day.  Anwitha Mapes Barbra Sarks

## 2019-02-18 NOTE — Progress Notes (Signed)
Turning pt, placing cooling blanket and rectal probe. HR increase d/t discomfort

## 2019-02-18 NOTE — Progress Notes (Signed)
Assisted tele visit to patient with daughter and son.  Ruben Anderson, Philis Nettle, RN

## 2019-02-18 NOTE — Progress Notes (Signed)
NAME:  Ruben Anderson, MRN:  [id number], DOB:  [birth year], LOS: 3 ADMISSION DATE:  02/12/2019, CONSULTATION DATE:  01/27/2019 REFERRING MD: Jeraldine Loots  CHIEF COMPLAINT:  Cardiac arrest   Brief History   68 year old male out of hospital cardiac arrest, unclear if asystole or PEA, CPR for approximately 10 minutes with return of spontaneous circulation prehospital, unresponsive.  Intubated, central line placed in ER for hypotension.  PCCM requested to admit.  History of present illness   68 year old male with only known history of probable COPD followed by Dr. Sherene Sires, no PFTs on file, current smoker who presented to ER via EMS.  Reportedly the patient was in his "normal" state of health on 12/19, however family stated that the patient contacted them and they called EMS the patient's behalf today, unclear events of today leading up to that.  However per ER d/w family it was reported that patient was not feeling well for at least the past week and during a physician visit earlier this week he was reportedly hypoxic with saturation of 59% on room air.  He reportedly declined emergency evaluation at that time.  On EMS arrival the patient was unresponsive with asystole or PEA- unclear as to the shoulder rhythm.  He received CPR with return of spontaneous circulation after approximately 10 minutes and 2 rounds of 1 mg epinephrine.Brooke Dare airway was placed.  He was unresponsive in route.  In the ER he was intubated, central line placed due to hypotension.  Hypercarbic with PCO2 of 87.  LFTs elevated.  Lactic acid 5.5.  UDS negative.  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  Incidental finding of soft tissue density on the left external auditory canal with erosion of the adjacent temporal bone.  PCCM requested to admit patient.  Past Medical History  No past medical history on file.  COPD only reported history.   Significant Hospital Events   12/20  Admitted   Consults:  PCCM   Procedures:  12/20 ETT>> 12/20 R femoral CVC>> R radial art line 12/20 >   Significant Diagnostic Tests:  12/20 CT Head  > neg for acute intracranial process. Soft tissue density in left external auditory canal with erosion of the adjacent left temporal bone.  Recommend EENT consultation. 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 EEG 12/21 > no definite seizure.  Technically difficult study suggestive of profound diffuse encephalopathic changes.  Micro Data:  SARS Coronavirus 2 negative Influenza A&B negative    Antimicrobials:  NA  Interim history/subjective:  Has begun following simple commands.  Objective   Blood pressure (!) 119/57, pulse 83, temperature 98.5 F (36.9 C), temperature source Axillary, resp. rate 20, height 5\' 9"  (1.753 m), weight 74.1 kg, SpO2 100 %.    Vent Mode: PRVC FiO2 (%):  [40 %] 40 % Set Rate:  [20 bmp] 20 bmp Vt Set:  [570 mL] 570 mL PEEP:  [5 cmH20] 5 cmH20 Plateau Pressure:  [14 cmH20-20 cmH20] 14 cmH20   Intake/Output Summary (Last 24 hours) at 02/18/2019 0849 Last data filed at 02/18/2019 0600 Gross per 24 hour  Intake 2726.11 ml  Output 915 ml  Net 1811.11 ml   Filed Weights   02/02/2019 2323 02/18/19 0456  Weight: 90.7 kg 74.1 kg    Examination: General: Cachectic male who arouses to voice and follows simple commands HEENT: Endotracheal tube gastric tube in place Neuro: Follows some  commands by moving extremities closing and blinking eyes CV: Heart sounds are regular normal sinus rhythm with a rate of 83 blood pressure 119/57 PULM: Diminished breath sounds throughout vent Pressure regulated volume control FIO2 40% PEEP 5 RATE 20 with no overbreathing VT 570  GI: soft, faint bowel sounds episode of emesis tube feeding decreased to trickle GU: Extremities: warm/dry,  edema  Skin: no rashes or lesions    Assessment &  Plan:   PEA cardiac arrest likely primary respiratory arrest leading to cardiac arrest.  Right bundle branch block per EKG otherwise no sig findings. Completed TTM Vasopressors as needed   Acute respiratory failure endotracheal intubation in the setting of severe underlying emphysema Wean per protocol 1223 did not have adequate respiratory drive for any attempted spontaneous breathing trial. Note he was on fentanyl drip at 100 mics will decrease fentanyl try again   ? 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 Consider cardiology consult as this may impede further attempts at weaning and extubation  AKI  Lab Results  Component Value Date   CREATININE 0.81 02/18/2019   CREATININE 1.33 (H) 02/17/2019   CREATININE 1.42 (H) 02/16/2019   Recent Labs  Lab 02/16/19 2135 02/17/19 0315 02/18/19 0509  K 3.3* 3.2* 3.5    Continue to monitor creatinine electrolytes no improvement in both 02/18/2019  Elevated LFTs  Monitor LFTs  Possible UTI - doubtful. Currently off all antimicrobial therapy Continue to monitor and observe for signs and symptoms of infection  Lung lesion per EMR review, seen on previous CT. We will need outpatient follow-up in the future for further evaluation.  Left external auditory canal soft tissue density with erosion of adjacent temporal bone. He will need an ENT consult in the future  Best practice:  Diet: TF's.  Currently on hold for vomiting will start back to trickle Pain/Anxiety/Delirium protocol (if indicated): Propofol gtt / Fentanyl gtt / Midazolam PRN.  RASS goal -1. VAP protocol (if indicated): In place. DVT prophylaxis: Heparin subcu GI prophylaxis: PPI Glucose control: Sliding scale insulin Mobility: Bedrest Code Status: Full Family Communication: Son Ruben Anderson was updated 02/17/2019.  He is to come in today on 1223 for further evaluation.  Note Ruben Anderson has woken up and  is following some commands Disposition: ICU  App CC time: 30 min.   Ruben Anderson Ruben Anderson ACNP Acute Care Nurse Practitioner [place] Please consult Amion 02/18/2019, 8:49 AM

## 2019-02-18 NOTE — Progress Notes (Signed)
Nutrition Follow up  DOCUMENTATION CODES:   Not applicable  INTERVENTION:   Tube feeding:  -Vital High Protein @ 20 ml/hr via OGT -Increase by 10 ml Q6 hours to goal rate of 45 ml/hr (1080 ml) -30 ml Prostat BID  Provides: 1820 kcals, 125 grams protein, 903 ml free water.   NUTRITION DIAGNOSIS:   Inadequate oral intake related to inability to eat as evidenced by NPO status.  Ongoing  GOAL:   Patient will meet greater than or equal to 90% of their needs   Addressed via TF  MONITOR:   TF tolerance, I & O's  REASON FOR ASSESSMENT:   Consult, Ventilator Enteral/tube feeding initiation and management  ASSESSMENT:   Pt with PMH of COPD admitted with OOH arrest with ROSC at 10 minutes. Normotherapy initiated.   Pt discussed during ICU rounds and with RN.   TTM complete. Off pressors/propofol. UOP better. Awake and following some commands. Vomited TF last night. Started back on trickles this am. Discussed titration plan back to goal with RN. Suspect improvement in CBGs once Tf advanced.   Admission weight: 90.7 kg  Current weight: 74.1 kg   Patient is currently intubated on ventilator support MV: 10.7 L/min Temp (24hrs), Avg:98.5 F (36.9 C), Min:97.6 F (36.4 C), Max:100.3 F (37.9 C)   I/O: +7,633 ml since admit UOP: 915 ml x 24 hrs   Drips: NS @ 100 ml/hr  Medications: SS novolog Labs: CBG 55-93    NUTRITION - FOCUSED PHYSICAL EXAM:  Deferred  Diet Order:   Diet Order            Diet NPO time specified  Diet effective now              EDUCATION NEEDS:   No education needs have been identified at this time  Skin:  Skin Assessment: Reviewed RN Assessment  Last BM:  PTA  Height:   Ht Readings from Last 1 Encounters:  02/19/2019 5\' 9"  (1.753 m)    Weight:   Wt Readings from Last 1 Encounters:  02/18/19 74.1 kg    Ideal Body Weight:  72.7 kg  BMI:  Body mass index is 24.12 kg/m.  Estimated Nutritional Needs:   Kcal:  1839  kcal  Protein:  110-125 grams  Fluid:  >/= 1.8 L/day   Mariana Single RD, LDN Clinical Nutrition Pager # - [phone]

## 2019-02-18 NOTE — Progress Notes (Signed)
Wasted 150 ml of Fentanyl drip with Tresea Mall RN.

## 2019-02-18 NOTE — Progress Notes (Signed)
[facility] Progress Note Patient Name: Primo Innis DOB: 12-08-1950 MRN: [id number]   Date of Service  02/18/2019  HPI/Events of Note  Temp spike to 103.1  eICU Interventions  Blood cultures x 2, cooling blanket, respiratory cultures x 1, urinalysis        Frederik Pear 02/18/2019, 9:48 PM

## 2019-02-18 NOTE — Progress Notes (Signed)
Patient's blood glucose 69. 12.5 ml dextrose bristojet pushed per protocol.

## 2019-02-19 ENCOUNTER — Inpatient Hospital Stay (HOSPITAL_COMMUNITY): Payer: Medicare HMO

## 2019-02-19 LAB — BASIC METABOLIC PANEL
Anion gap: 10 (ref 5–15)
BUN: 29 mg/dL — ABNORMAL HIGH (ref 8–23)
CO2: 24 mmol/L (ref 22–32)
Calcium: 8.4 mg/dL — ABNORMAL LOW (ref 8.9–10.3)
Chloride: 109 mmol/L (ref 98–111)
Creatinine, Ser: 0.65 mg/dL (ref 0.61–1.24)
GFR calc Af Amer: >60 mL/min (ref >60)
GFR calc non Af Amer: >60 mL/min (ref >60)
Glucose, Bld: 108 mg/dL — ABNORMAL HIGH (ref 70–99)
Potassium: 4.4 mmol/L (ref 3.5–5.1)
Sodium: 143 mmol/L (ref 135–145)

## 2019-02-19 LAB — EXPECTORATED SPUTUM ASSESSMENT W GRAM STAIN, RFLX TO RESP C: Special Requests: NORMAL

## 2019-02-19 LAB — MAGNESIUM: Magnesium: 2.2 mg/dL (ref 1.7–2.4)

## 2019-02-19 LAB — GLUCOSE, CAPILLARY
Glucose-Capillary: 111 mg/dL — ABNORMAL HIGH (ref 70–99)
Glucose-Capillary: 112 mg/dL — ABNORMAL HIGH (ref 70–99)
Glucose-Capillary: 113 mg/dL — ABNORMAL HIGH (ref 70–99)
Glucose-Capillary: 125 mg/dL — ABNORMAL HIGH (ref 70–99)
Glucose-Capillary: 88 mg/dL (ref 70–99)
Glucose-Capillary: 90 mg/dL (ref 70–99)

## 2019-02-19 LAB — PHOSPHORUS: Phosphorus: 2.9 mg/dL (ref 2.5–4.6)

## 2019-02-19 MED ORDER — METOPROLOL TARTRATE 12.5 MG HALF TABLET
12.5000 mg | ORAL_TABLET | Freq: Two times a day (BID) | ORAL | Status: DC
  Administered 2019-02-19 – 2019-02-22 (×7): 12.5 mg via ORAL
  Filled 2019-02-19 (×7): qty 1

## 2019-02-19 MED ORDER — IOHEXOL 300 MG/ML  SOLN
75.0000 mL | Freq: Once | INTRAMUSCULAR | Status: AC | PRN
  Administered 2019-02-19: 13:00:00 75 mL via INTRAVENOUS

## 2019-02-19 MED ORDER — HYDRALAZINE HCL 20 MG/ML IJ SOLN
10.0000 mg | INTRAMUSCULAR | Status: DC | PRN
  Administered 2019-02-19: 14:00:00 20 mg via INTRAVENOUS
  Filled 2019-02-19: qty 1

## 2019-02-19 NOTE — Progress Notes (Signed)
CT scan results discussed with Dr. Lynetta Mare. Chest physiotherapy Q4H ordered. Respiratory notified.  Roselyn Reef Kaitland Lewellyn,RN

## 2019-02-19 NOTE — Progress Notes (Addendum)
NAME:  Ruben Anderson, MRN:  [id number], DOB:  [birth year], LOS: 4 ADMISSION DATE:  March 02, 2019, CONSULTATION DATE:  2019/03/02 REFERRING MD: Jeraldine Loots  CHIEF COMPLAINT:  Cardiac arrest   Brief History   68 year old male out of hospital cardiac arrest, unclear if asystole or PEA, CPR for approximately 10 minutes with return of spontaneous circulation prehospital, unresponsive.  Intubated, central line placed in ER for hypotension.  PCCM requested to admit.  History of present illness   68 year old male with only known history of probable COPD followed by Dr. Sherene Sires, no PFTs on file, current smoker who presented to ER via EMS.  Reportedly the patient was in his "normal" state of health on 12/19, however family stated that the patient contacted them and they called EMS the patient's behalf today, unclear events of today leading up to that.  However per ER d/w family it was reported that patient was not feeling well for at least the past week and during a physician visit earlier this week he was reportedly hypoxic with saturation of 59% on room air.  He reportedly declined emergency evaluation at that time.  On EMS arrival the patient was unresponsive with asystole or PEA- unclear as to the shoulder rhythm.  He received CPR with return of spontaneous circulation after approximately 10 minutes and 2 rounds of 1 mg epinephrine.Brooke Dare airway was placed.  He was unresponsive in route.  In the ER he was intubated, central line placed due to hypotension.  Hypercarbic with PCO2 of 87.  LFTs elevated.  Lactic acid 5.5.  UDS negative.  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  Incidental finding of soft tissue density on the left external auditory canal with erosion of the adjacent temporal bone.  PCCM requested to admit patient.  Past Medical History  No past medical history on file.  COPD only reported history.   Significant Hospital Events   12/20  Admitted   Consults:  PCCM   Procedures:  12/20 ETT>> 12/20 R femoral CVC>> R radial art line 12/20 >   Significant Diagnostic Tests:  12/20 CT Head  > neg for acute intracranial process. Soft tissue density in left external auditory canal with erosion of the adjacent left temporal bone.  Recommend EENT consultation. 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 EEG 12/21 > no definite seizure.  Technically difficult study suggestive of profound diffuse encephalopathic changes.  Micro Data:  SARS Coronavirus 2 negative Influenza A&B negative    Antimicrobials:  NA  Interim history/subjective:  Continue to follow simple commands.  Underlying COPD coupled with weakness preclude any chance of extubation.  Objective   Blood pressure (!) 158/98, pulse 81, temperature 97.8 F (36.6 C), temperature source Oral, resp. rate (!) 24, height 5\' 9"  (1.753 m), weight 74.1 kg, SpO2 100 %.    Vent Mode: PSV;CPAP FiO2 (%):  [40 %] 40 % Set Rate:  [20 bmp] 20 bmp Vt Set:  [570 mL] 570 mL PEEP:  [5 cmH20] 5 cmH20 Pressure Support:  [12 cmH20] 12 cmH20 Plateau Pressure:  [13 cmH20-18 cmH20] 18 cmH20   Intake/Output Summary (Last 24 hours) at 02/19/2019 1001 Last data filed at 02/19/2019 0700 Gross per 24 hour  Intake 515.94 ml  Output 730 ml  Net -214.06 ml   Filed Weights   03/02/19 2323 02/18/19 0456  Weight: 90.7 kg 74.1 kg    Examination: General: Cachectic  male who is very weak but follows commands  HEENT: Endotracheal tube gastric tube in place Neuro: Generalized weakness possibly worse on left than right does seem to have a right gaze preference CV: Heart sounds are regular known to be hypertensive PULM: Decreased breath sounds bilaterally Vent pressure support 12 cm FIO2 40 PEEP 5  RATE spontaneous 18 VT approximately 450 cc  GI: soft, bsx4 active  GU: Extremities: warm/dry,  edema  Skin: no  rashes or lesions     Assessment & Plan:   PEA cardiac arrest likely primary respiratory arrest leading to cardiac arrest.  Right bundle branch block per EKG otherwise no sig findings. HTN Completed TTM Start p.o. Lopressor along with as needed IV Apresoline  Profound weakness is reported neurological weakness in upper extremities x8 months prior to admission. MRI of the brain has been canceled due to BB in head. Consider neurological consult This precludes any chance of extubation    Acute respiratory failure endotracheal intubation in the setting of severe underlying emphysema 02/19/2019 not weaning well.  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 Spoke with son 02/19/2019 concerning tracheostomy is a mode of successful liberation mechanical ventilatory support Further consideration of lung lesion work-up Further consideration to cardiology input   ? 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 Consider cardiology consult  AKI  Lab Results  Component Value Date   CREATININE 0.65 02/19/2019   CREATININE 0.81 02/18/2019   CREATININE 1.33 (H) 02/17/2019   Recent Labs  Lab 02/17/19 0315 02/18/19 0509 02/19/19 0221  K 3.2* 3.5 4.4    Continue to monitor creatinine and electrolytes  Elevated LFTs  Monitor hepatic profile   Lung lesion per EMR review, seen on previous CT. Further evaluation in future Note has had extensive weight loss over the last 8 months This may be interfering with his weaning process from the ventilator  Left external auditory canal soft tissue density with erosion of adjacent temporal bone. ENT consult in the future  Best practice:  Diet: TF's.  Currently on hold for vomiting will start back to trickle Pain/Anxiety/Delirium protocol (if indicated):  Propofol gtt / Fentanyl gtt / Midazolam PRN.  RASS goal -1. VAP protocol (if indicated): In place. DVT prophylaxis: Heparin subcu GI prophylaxis: PPI Glucose control: Sliding scale insulin Mobility: Bedrest Code Status: Full Family Communication: 02/19/2019 spoke at length with son Dayne Chait Junior considering the fact his father is 23 weaning well from ventilator most likely secondary to his severe underlying emphysema.  Also explained in detail possible tracheostomy scenario to facilitate his, not the ventilator.  The son reports that his dad's been having upper extremity weakness for 8 to 9 months and has gone from 230 pounds down to 180 pounds prior to admission. Disposition: ICU  App CC time: 30 min.   Richardson Landry Vyctoria Dickman ACNP Acute Care Nurse Practitioner [place] Please consult Amion 02/19/2019, 10:01 AM

## 2019-02-19 NOTE — Progress Notes (Signed)
Pt was transported to and from CT via ventilator with no complications. Pt back in room 2H15 doing well at this time. RT will continue to monitor pt status.

## 2019-02-19 NOTE — Progress Notes (Signed)
eLink Physician-Brief Progress Note Patient Name: Ruben Anderson DOB: 1950/04/13 MRN: [id number]   Date of Service  02/19/2019  HPI/Events of Note  Pt with urinary retention with 500 ml on bladder scan  eICU Interventions  Order entered for in/out bladder cath x 1        Ildefonso Keaney U Kalis Friese 02/19/2019, 6:04 AM

## 2019-02-19 NOTE — Progress Notes (Signed)
Gave Fentanyl 35mcg as per Elink.

## 2019-02-20 LAB — GLUCOSE, CAPILLARY
Glucose-Capillary: 115 mg/dL — ABNORMAL HIGH (ref 70–99)
Glucose-Capillary: 118 mg/dL — ABNORMAL HIGH (ref 70–99)
Glucose-Capillary: 122 mg/dL — ABNORMAL HIGH (ref 70–99)
Glucose-Capillary: 125 mg/dL — ABNORMAL HIGH (ref 70–99)
Glucose-Capillary: 82 mg/dL (ref 70–99)
Glucose-Capillary: 86 mg/dL (ref 70–99)
Glucose-Capillary: 98 mg/dL (ref 70–99)

## 2019-02-20 LAB — PHOSPHORUS: Phosphorus: 2.3 mg/dL — ABNORMAL LOW (ref 2.5–4.6)

## 2019-02-20 LAB — CBC WITH DIFFERENTIAL/PLATELET
Abs Immature Granulocytes: 0.03 10*3/uL (ref 0.00–0.07)
Basophils Absolute: 0 10*3/uL (ref 0.0–0.1)
Basophils Relative: 0 %
Eosinophils Absolute: 0 10*3/uL (ref 0.0–0.5)
Eosinophils Relative: 0 %
HCT: 38.4 % — ABNORMAL LOW (ref 39.0–52.0)
Hemoglobin: 11.8 g/dL — ABNORMAL LOW (ref 13.0–17.0)
Immature Granulocytes: 0 %
Lymphocytes Relative: 8 %
Lymphs Abs: 0.7 10*3/uL (ref 0.7–4.0)
MCH: 27.8 pg (ref 26.0–34.0)
MCHC: 30.7 g/dL (ref 30.0–36.0)
MCV: 90.4 fL (ref 80.0–100.0)
Monocytes Absolute: 0.8 10*3/uL (ref 0.1–1.0)
Monocytes Relative: 9 %
Neutro Abs: 7 10*3/uL (ref 1.7–7.7)
Neutrophils Relative %: 83 %
Platelets: 174 10*3/uL (ref 150–400)
RBC: 4.25 MIL/uL (ref 4.22–5.81)
RDW: 14 % (ref 11.5–15.5)
WBC: 8.5 10*3/uL (ref 4.0–10.5)
nRBC: 0 % (ref 0.0–0.2)

## 2019-02-20 LAB — HEPATIC FUNCTION PANEL
ALT: 49 U/L — ABNORMAL HIGH (ref 0–44)
AST: 25 U/L (ref 15–41)
Albumin: 2.3 g/dL — ABNORMAL LOW (ref 3.5–5.0)
Alkaline Phosphatase: 46 U/L (ref 38–126)
Bilirubin, Direct: 0.2 mg/dL (ref 0.0–0.2)
Indirect Bilirubin: 0.3 mg/dL (ref 0.3–0.9)
Total Bilirubin: 0.5 mg/dL (ref 0.3–1.2)
Total Protein: 5.8 g/dL — ABNORMAL LOW (ref 6.5–8.1)

## 2019-02-20 LAB — BASIC METABOLIC PANEL
Anion gap: 8 (ref 5–15)
BUN: 27 mg/dL — ABNORMAL HIGH (ref 8–23)
CO2: 29 mmol/L (ref 22–32)
Calcium: 8.2 mg/dL — ABNORMAL LOW (ref 8.9–10.3)
Chloride: 109 mmol/L (ref 98–111)
Creatinine, Ser: 0.5 mg/dL — ABNORMAL LOW (ref 0.61–1.24)
GFR calc Af Amer: >60 mL/min (ref >60)
GFR calc non Af Amer: >60 mL/min (ref >60)
Glucose, Bld: 122 mg/dL — ABNORMAL HIGH (ref 70–99)
Potassium: 3.4 mmol/L — ABNORMAL LOW (ref 3.5–5.1)
Sodium: 146 mmol/L — ABNORMAL HIGH (ref 135–145)

## 2019-02-20 LAB — MAGNESIUM: Magnesium: 1.9 mg/dL (ref 1.7–2.4)

## 2019-02-20 MED ORDER — SODIUM CHLORIDE 0.9 % IV BOLUS
250.0000 mL | Freq: Once | INTRAVENOUS | Status: AC
  Administered 2019-02-20: 14:00:00 250 mL via INTRAVENOUS

## 2019-02-20 MED ORDER — FENTANYL CITRATE (PF) 100 MCG/2ML IJ SOLN
100.0000 ug | Freq: Once | INTRAMUSCULAR | Status: AC
  Administered 2019-02-20: 14:00:00 100 ug via INTRAVENOUS
  Filled 2019-02-20: qty 2

## 2019-02-20 MED ORDER — VECURONIUM BROMIDE 10 MG IV SOLR
5.0000 mg | Freq: Once | INTRAVENOUS | Status: AC
  Administered 2019-02-20: 14:00:00 5 mg via INTRAVENOUS
  Filled 2019-02-20: qty 10

## 2019-02-20 MED ORDER — POTASSIUM CHLORIDE 20 MEQ/15ML (10%) PO SOLN
20.0000 meq | ORAL | Status: AC
  Administered 2019-02-20 (×2): 20 meq
  Filled 2019-02-20 (×2): qty 15

## 2019-02-20 MED ORDER — IPRATROPIUM-ALBUTEROL 0.5-2.5 (3) MG/3ML IN SOLN
3.0000 mL | Freq: Four times a day (QID) | RESPIRATORY_TRACT | Status: DC
  Administered 2019-02-20 – 2019-02-22 (×9): 3 mL via RESPIRATORY_TRACT
  Filled 2019-02-20 (×9): qty 3

## 2019-02-20 MED ORDER — MIDAZOLAM HCL 2 MG/2ML IJ SOLN
4.0000 mg | Freq: Once | INTRAMUSCULAR | Status: AC
  Administered 2019-02-20: 14:00:00 4 mg via INTRAVENOUS
  Filled 2019-02-20: qty 4

## 2019-02-20 MED ORDER — VECURONIUM BOLUS VIA INFUSION: 5.0000 mg | Freq: Once | INTRAVENOUS | Status: DC

## 2019-02-20 MED ORDER — STERILE WATER FOR INJECTION IJ SOLN
INTRAMUSCULAR | Status: AC
  Administered 2019-02-20: 14:00:00 10 mL
  Filled 2019-02-20: qty 10

## 2019-02-20 NOTE — Progress Notes (Signed)
NAME:  Ruben Anderson, MRN:  [id number], DOB:  [birth year], LOS: 5 ADMISSION DATE:  02/13/2019, CONSULTATION DATE:  02/21/2019 REFERRING MD: Vanita Panda  CHIEF COMPLAINT:  Cardiac arrest   Brief History   68 year old male out of hospital cardiac arrest, unclear if asystole or PEA, CPR for approximately 10 minutes with return of spontaneous circulation prehospital, unresponsive.  Intubated, central line placed in ER for hypotension.  PCCM requested to admit.  History of present illness   68 year old male with only known history of probable COPD followed by Dr. Melvyn Novas, no PFTs on file, current smoker who presented to ER via EMS.  Reportedly the patient was in his "normal" state of health on 12/19, however family stated that the patient contacted them and they called EMS the patient's behalf today, unclear events of today leading up to that.  However per ER d/w family it was reported that patient was not feeling well for at least the past week and during a physician visit earlier this week he was reportedly hypoxic with saturation of 59% on room air.  He reportedly declined emergency evaluation at that time.  On EMS arrival the patient was unresponsive with asystole or PEA- unclear as to the shoulder rhythm.  He received CPR with return of spontaneous circulation after approximately 10 minutes and 2 rounds of 1 mg epinephrine.[name] airway was placed.  He was unresponsive in route.  In the ER he was intubated, central line placed due to hypotension.  Hypercarbic with PCO2 of 87.  LFTs elevated.  Lactic acid 5.5.  UDS negative.  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  Incidental finding of soft tissue density on the left external auditory canal with erosion of the adjacent temporal bone.  PCCM requested to admit patient.  Past Medical History  No past medical history on file.  COPD only reported history.   Significant Hospital Events   12/20  Admitted   Consults:  PCCM   Procedures:  12/20 ETT>> 12/20 R femoral CVC>> R radial art line 12/20 >   Significant Diagnostic Tests:  12/20 CT Head  > neg for acute intracranial process. Soft tissue density in left external auditory canal with erosion of the adjacent left temporal bone.  Recommend EENT consultation. 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 EEG 12/21 > no definite seizure.  Technically difficult study suggestive of profound diffuse encephalopathic changes.  Micro Data:  SARS Coronavirus 2 negative Influenza A&B negative    Antimicrobials:  NA  Interim history/subjective:  Remains weak poor pulmonary compliance with CT of the chest revealing right upper lobe consolidation.  Objective   Blood pressure 122/84, pulse 90, temperature 99 F (37.2 C), temperature source Oral, resp. rate 19, height 5\' 9"  (1.753 m), weight 74.1 kg, SpO2 99 %.    Vent Mode: PRVC FiO2 (%):  [40 %] 40 % Set Rate:  [20 bmp] 20 bmp Vt Set:  [570 mL] 570 mL PEEP:  [5 cmH20] 5 cmH20 Pressure Support:  [10 cmH20] 10 cmH20 Plateau Pressure:  [14 cmH20-18 cmH20] 14 cmH20   Intake/Output Summary (Last 24 hours) at 02/20/2019 0756 Last data filed at 02/20/2019 0700 Gross per 24 hour  Intake 1125 ml  Output 1250 ml  Net -125 ml   Filed Weights   02/13/2019 2323 02/18/19 0456  Weight: 90.7 kg 74.1 kg    Examination: General: Frail cachectic male on  full mechanical ventilatory support HEENT: Endotracheal tube gastric tube in place Neuro: Moves extremities weakly. CV: Heart sounds are regular PULM: Decreased breath sounds on the right coarse rhonchi bilaterally Vent pressure regulated volume control FIO2 40% PEEP 5 will increased to 10 due to lung consolidation RATE 20+4 VT 570  GI: soft, bsx4 active  GU: Condom cath in place requiring intermittent in and out catheterizations Extremities: warm/dry, negative  edema  Skin: no rashes or lesions     Assessment & Plan:   PEA cardiac arrest likely primary respiratory arrest leading to cardiac arrest.  Right bundle branch block per EKG otherwise no sig findings. HTN Completed TTM Started on p.o. Lopressor 1224 with improvement in blood pressure  Profound weakness is reported neurological weakness in upper extremities x8 months prior to admission. Unable to do MRI as there is a BB in his head Small neurology consult in the future     Acute respiratory failure endotracheal intubation in the setting of severe underlying emphysema 02/19/2019 CT of the chest reveals right upper lobe right lower lobe consolidation this would preclude any attempted extubation. Remains extremely weak therefore not amenable to extubation Pulmonary toilet has been ordered The son Krue Peterka, Montez Hageman. was informed of possibility of tracheostomy in the near future   ? 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 Consider cardiology consult in the future  AKI  Lab Results  Component Value Date   CREATININE 0.50 (L) 02/20/2019   CREATININE 0.65 02/19/2019   CREATININE 0.81 02/18/2019   Recent Labs  Lab 02/18/19 0509 02/19/19 0221 02/20/19 0340  K 3.5 4.4 3.4*    Monitor replete electrolytes as needed  Elevated LFTs  Monitor hepatic profile   Lung lesion per EMR review, seen on previous CT. CT with contrast on 02/19/2019 small right upper lobe bronchus with small polypoid lesion noted as none seen on prior CT scan right upper lobe is now completely occluded by secretions.  Secretions altered in the right lower lobe Pulmonary toilet This precludes any chance of extubation Continue to monitor    Left external auditory canal soft tissue density with erosion of adjacent temporal bone. Need ENT consult in the future  Best practice:  Diet: TF's.  Currently on hold for vomiting will start back to  trickle Pain/Anxiety/Delirium protocol (if indicated): Propofol gtt / Fentanyl gtt / Midazolam PRN.  RASS goal -1. VAP protocol (if indicated): In place. DVT prophylaxis: Heparin subcu GI prophylaxis: PPI Glucose control: Sliding scale insulin Mobility: Bedrest Code Status: Full Family Communication: 02/19/2019 spoke at length with son Abdulkarim Eberlin Junior considering the fact his father is 49 weaning well from ventilator most likely secondary to his severe underlying emphysema.  Also explained in detail possible tracheostomy scenario to facilitate his, not the ventilator.  The son reports that his dad's been having upper extremity weakness for 8 to 9 months and has gone from 230 pounds down to 180 pounds prior to admission. Disposition: ICU  App CC time: 30 min.   Brett Canales Mckenzy Salazar ACNP Acute Care Nurse Practitioner [facility] Please consult Amion 02/20/2019, 7:56 AM

## 2019-02-20 NOTE — Procedures (Addendum)
Bronchoscopy Procedure Note Ruben Anderson [id number] [birth year]  Procedure: Bronchoscopy Indications: Diagnostic evaluation of the airways and Obtain specimens for culture and/or other diagnostic studies  Procedure Details Consent: Risks of procedure as well as the alternatives and risks of each were explained to the (patient/caregiver).  Consent for procedure obtained. Time Out: Verified patient identification, verified procedure, site/side was marked, verified correct patient position, special equipment/implants available, medications/allergies/relevent history reviewed, required imaging and test results available.  Performed  In preparation for procedure, patient was given 100% FiO2 and bronchoscope lubricated. Sedation: Benzodiazepines, Muscle relaxants and fentanyl  Airway entered and the following bronchi were examined: RUL, RML, RLL, LUL, LLL and Bronchi.   No endobronchial lesions seen, no external compression.  White tenacious mucopurulent secretions noted throughout the right lung most predominantly in the lower lobes. No secretions noted on the left.      Procedures performed: BAL performed RML, secretions cleared. Bronchoscope removed.    Evaluation Hemodynamic Status: Transient hypotension treated with fluid; O2 sats: stable throughout Patient's Current Condition: stable Specimens:  Sent purulent fluid Complications: No apparent complications Patient did tolerate procedure well.   Einar Grad Ruben Anderson 02/20/2019

## 2019-02-20 NOTE — Progress Notes (Signed)
CPT not done, patient's bed not responding when RT tried to complete patient's CPT. RN notified that the bed was not working appropriately .

## 2019-02-20 NOTE — Progress Notes (Signed)
Bronch samples walked down to lab w/ appropriate labels, and requisitions-given to lab personal.  Verified specamine orders/requisitions w/ MD prior to taking samples to lab.

## 2019-02-20 NOTE — Progress Notes (Signed)
[facility] ADULT ICU REPLACEMENT PROTOCOL FOR AM LAB REPLACEMENT ONLY  The patient does apply for the [facility] Adult ICU Electrolyte Replacment Protocol based on the criteria listed below:   1. Is GFR >/= 40 ml/min? Yes.    Patient's GFR today is >60 2. Is urine output >/= 0.5 ml/kg/hr for the last 6 hours? Yes.   Patient's UOP is 1.1 ml/kg/hr 3. Is BUN < 60 mg/dL? Yes.    Patient's BUN today is 27 4. Abnormal electrolyte(s): K-3.4 5. Ordered repletion with: per protocol 6. If a panic level lab has been reported, has the CCM MD in charge been notified? Yes.  .   Physician:  Dr. Cleon Gustin, Philis Nettle 02/20/2019 6:04 AM

## 2019-02-21 ENCOUNTER — Inpatient Hospital Stay (HOSPITAL_COMMUNITY): Payer: Medicare HMO

## 2019-02-21 LAB — GLUCOSE, CAPILLARY
Glucose-Capillary: 98 mg/dL (ref 70–99)
Glucose-Capillary: 112 mg/dL — ABNORMAL HIGH (ref 70–99)
Glucose-Capillary: 101 mg/dL — ABNORMAL HIGH (ref 70–99)
Glucose-Capillary: 112 mg/dL — ABNORMAL HIGH (ref 70–99)
Glucose-Capillary: 106 mg/dL — ABNORMAL HIGH (ref 70–99)

## 2019-02-21 LAB — CULTURE, RESPIRATORY W GRAM STAIN
Culture: NORMAL
Special Requests: NORMAL

## 2019-02-21 LAB — BASIC METABOLIC PANEL
Anion gap: 8 (ref 5–15)
BUN: 26 mg/dL — ABNORMAL HIGH (ref 8–23)
CO2: 30 mmol/L (ref 22–32)
Calcium: 8.2 mg/dL — ABNORMAL LOW (ref 8.9–10.3)
Chloride: 111 mmol/L (ref 98–111)
Creatinine, Ser: 0.63 mg/dL (ref 0.61–1.24)
GFR calc Af Amer: >60 mL/min (ref >60)
GFR calc non Af Amer: >60 mL/min (ref >60)
Glucose, Bld: 122 mg/dL — ABNORMAL HIGH (ref 70–99)
Potassium: 4 mmol/L (ref 3.5–5.1)
Sodium: 149 mmol/L — ABNORMAL HIGH (ref 135–145)

## 2019-02-21 LAB — CBC WITH DIFFERENTIAL/PLATELET
Abs Immature Granulocytes: 0.05 10*3/uL (ref 0.00–0.07)
Basophils Absolute: 0 10*3/uL (ref 0.0–0.1)
Basophils Relative: 0 %
Eosinophils Absolute: 0 10*3/uL (ref 0.0–0.5)
Eosinophils Relative: 0 %
HCT: 38.5 % — ABNORMAL LOW (ref 39.0–52.0)
Hemoglobin: 11.9 g/dL — ABNORMAL LOW (ref 13.0–17.0)
Immature Granulocytes: 1 %
Lymphocytes Relative: 8 %
Lymphs Abs: 0.8 10*3/uL (ref 0.7–4.0)
MCH: 27.8 pg (ref 26.0–34.0)
MCHC: 30.9 g/dL (ref 30.0–36.0)
MCV: 90 fL (ref 80.0–100.0)
Monocytes Absolute: 1.1 10*3/uL — ABNORMAL HIGH (ref 0.1–1.0)
Monocytes Relative: 10 %
Neutro Abs: 8.4 10*3/uL — ABNORMAL HIGH (ref 1.7–7.7)
Neutrophils Relative %: 81 %
Platelets: 174 10*3/uL (ref 150–400)
RBC: 4.28 MIL/uL (ref 4.22–5.81)
RDW: 14 % (ref 11.5–15.5)
WBC: 10.4 10*3/uL (ref 4.0–10.5)
nRBC: 0 % (ref 0.0–0.2)

## 2019-02-21 LAB — MAGNESIUM: Magnesium: 2 mg/dL (ref 1.7–2.4)

## 2019-02-21 LAB — PHOSPHORUS: Phosphorus: 2.7 mg/dL (ref 2.5–4.6)

## 2019-02-21 MED ORDER — OXYCODONE HCL 5 MG/5ML PO SOLN
5.0000 mg | ORAL | Status: DC | PRN
  Administered 2019-02-21 (×2): 5 mg
  Filled 2019-02-21 (×2): qty 5

## 2019-02-21 MED ORDER — CLONAZEPAM 0.1 MG/ML ORAL SUSPENSION
0.5000 mg | Freq: Two times a day (BID) | ORAL | Status: DC
  Filled 2019-02-21: qty 5

## 2019-02-21 MED ORDER — MIDAZOLAM HCL 2 MG/2ML IJ SOLN
2.0000 mg | INTRAMUSCULAR | Status: DC | PRN
  Administered 2019-02-21 – 2019-02-22 (×6): 2 mg via INTRAVENOUS
  Filled 2019-02-21 (×6): qty 2

## 2019-02-21 MED ORDER — CLONAZEPAM 0.5 MG PO TBDP
0.5000 mg | ORAL_TABLET | Freq: Two times a day (BID) | ORAL | Status: DC
  Administered 2019-02-21 – 2019-02-22 (×3): 0.5 mg
  Filled 2019-02-21 (×3): qty 1

## 2019-02-21 NOTE — Progress Notes (Signed)
Checked patient's notes to procure phone number of grandson.  Not listed on Facesheet.  De Burrs Chaplain Resident

## 2019-02-21 NOTE — Progress Notes (Signed)
eLink Physician-Brief Progress Note Patient Name: Ruben Anderson DOB: 08-Mar-1950 MRN: [id number]   Date of Service  02/21/2019  HPI/Events of Note  Pt with sub-optimal sedation  eICU Interventions  Versed 2 mg iv Q 2 hours PRN ordered        [name] [name] 02/21/2019, 1:52 AM

## 2019-02-21 NOTE — Progress Notes (Signed)
NAME:  Ruben Anderson, MRN:  [id number], DOB:  June 19, 1950, LOS: 6 ADMISSION DATE:  2019/02/25, CONSULTATION DATE:  02/25/2019 REFERRING MD: Vanita Panda  CHIEF COMPLAINT:  Cardiac arrest   Brief History   68 year old male out of hospital cardiac arrest, unclear if asystole or PEA, CPR for approximately 10 minutes with return of spontaneous circulation prehospital, unresponsive.  Intubated, central line placed in ER for hypotension.  PCCM requested to admit.  History of present illness   68 year old male with only known history of probable COPD followed by Dr. Melvyn Novas, no PFTs on file, current smoker who presented to ER via EMS.  Reportedly the patient was in his "normal" state of health on 12/19, however family stated that the patient contacted them and they called EMS the patient's behalf today, unclear events of today leading up to that.  However per ER d/w family it was reported that patient was not feeling well for at least the past week and during a physician visit earlier this week he was reportedly hypoxic with saturation of 59% on room air.  He reportedly declined emergency evaluation at that time.  On EMS arrival the patient was unresponsive with asystole or PEA- unclear as to the shoulder rhythm.  He received CPR with return of spontaneous circulation after approximately 10 minutes and 2 rounds of 1 mg epinephrine.[name] airway was placed.  He was unresponsive in route.  In the ER he was intubated, central line placed due to hypotension.  Hypercarbic with PCO2 of 87.  LFTs elevated.  Lactic acid 5.5.  UDS negative.  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  Incidental finding of soft tissue density on the left external auditory canal with erosion of the adjacent temporal bone.  PCCM requested to admit patient.  Past Medical History  No past medical history on file.  COPD only reported history.   Significant Hospital Events   12/20  Admitted   Consults:  PCCM   Procedures:  12/20 ETT>> 12/20 R femoral CVC>> R radial art line 12/20 >   Significant Diagnostic Tests:  12/20 CT Head  > neg for acute intracranial process. Soft tissue density in left external auditory canal with erosion of the adjacent left temporal bone.  Recommend EENT consultation. 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 EEG 12/21 > no definite seizure.  Technically difficult study suggestive of profound diffuse encephalopathic changes.  Micro Data:  SARS Coronavirus 2 negative Influenza A&B negative    Antimicrobials:  NA  Interim history/subjective:  He will need tracheostomy in the future to have any chance of becoming liberated from mechanical ventilatory support.  Objective   Blood pressure (!) 72/53, pulse 77, temperature 99.4 F (37.4 C), temperature source Oral, resp. rate 20, height 5\' 9"  (1.753 m), weight 77.2 kg, SpO2 100 %.    Vent Mode: PRVC FiO2 (%):  [40 %] 40 % Set Rate:  [20 bmp] 20 bmp Vt Set:  [570 mL] 570 mL PEEP:  [10 cmH20] 10 cmH20 Pressure Support:  [15 cmH20] 15 cmH20 Plateau Pressure:  [20 cmH20-23 cmH20] 22 cmH20   Intake/Output Summary (Last 24 hours) at 02/21/2019 0854 Last data filed at 02/21/2019 0600 Gross per 24 hour  Intake 2225.46 ml  Output 575 ml  Net 1650.46 ml   Filed Weights   02/18/19 0456 02/21/19 0425  Weight: 74.1 kg 77.2 kg    Examination: General:  Frail cachectic male who is in obvious discomfort and diaphoretic HEENT: Endotracheal tube gastric tube in place Neuro: Moves weakly.  Not following commands well today CV: Heart sounds are regular PULM: Coarse rhonchi bilaterally right greater than left Vent pressure support ventilation of 12 FIO2 40% PEEP 10 RATE spontaneous 16 VT approximately 466  GI: soft, bsx4 active  GU: Extremities: warm/dry,  edema  Skin: no rashes or lesions      Assessment  & Plan:   PEA cardiac arrest likely primary respiratory arrest leading to cardiac arrest.  Right bundle branch block per EKG otherwise no sig findings. HTN Completed TTM  Profound weakness is reported neurological weakness in upper extremities x8 months prior to admission. Consider neurology consult in the future    Acute respiratory failure endotracheal intubation in the setting of severe underlying emphysema 02/19/2019 CT of the chest reveals right upper lobe right lower lobe consolidation this would preclude any attempted extubation. Underwent fiberoptic bronchoscopy 1225 noted to have thick tenacious secretions in the bronchus Pulmonary toilet as tolerated He is not a candidate for extubation 1226 start low-dose Klonopin anoxic codon due to agitation from being intubated.  Try to wean from IV sedation.  He appears to be more chronic in nature than acute therefore tracheostomy in the future will be needed. 02/21/2019 attempted to call son Everlean Alstrom left a message.   ? 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 Questionable cardiology consult.  AKI  Lab Results  Component Value Date   CREATININE 0.63 02/21/2019   CREATININE 0.50 (L) 02/20/2019   CREATININE 0.65 02/19/2019   Recent Labs  Lab 02/19/19 0221 02/20/19 0340 02/21/19 0524  K 4.4 3.4* 4.0    Monitor creatinine and electrolytes replete as needed  Elevated LFTs  Monitor hepatic profile noted to have normalized on 02/20/2019   Lung lesion per EMR review, seen on previous CT. CT with contrast on 02/19/2019 small right upper lobe bronchus with small polypoid lesion noted as none seen on prior CT scan right upper lobe is now completely occluded by secretions.  Secretions altered in the right lower lobe Status post fiberoptic bronchoscopy 02/19/2019 per Dr. Denese Killings with obvious thick tenacious secretions occluding airway Continue pulmonary toilet He is not a candidate  for extubation Culture samples sent to lab   Left external auditory canal soft tissue density with erosion of adjacent temporal bone. Possible ENT consult in the future  Best practice:  Diet: TF's.  Currently on hold for vomiting will start back to trickle Pain/Anxiety/Delirium protocol (if indicated): Propofol gtt / Fentanyl gtt / Midazolam PRN.  RASS goal -1. VAP protocol (if indicated): In place. DVT prophylaxis: Heparin subcu GI prophylaxis: PPI Glucose control: Sliding scale insulin Mobility: Bedrest Code Status: Full Family Communication: 02/19/2019 spoke at length with son Alvino Lechuga Junior considering the fact his father is 89 weaning well from ventilator most likely secondary to his severe underlying emphysema.  Also explained in detail possible tracheostomy scenario to facilitate his, not the ventilator.  The son reports that his dad's been having upper extremity weakness for 8 to 9 months and has gone from 230 pounds down to 180 pounds prior to admission. Disposition: ICU  App CC time: 30 min.   Brett Canales Kumiko Fishman ACNP Acute Care Nurse Practitioner [facility] Pulmonary/Critical Care Please consult Amion 02/21/2019, 8:54 AM

## 2019-02-21 NOTE — Significant Event (Signed)
Critical Care Attending Progress Note:  Spoke to son Zamari Vea who confirms that his father had been in failing health for several months with increasing dyspnea. I explained to him that the weight loss is likely the result of COPD related cachexia which portends a poor prognosis.  We both agreed that his father was trying to die when arrested and that we have resuscitated him to a state of limbo without hope of recovering a desirable quality of life and Shravan told me that his father had told him that he would not want to live in his current state.  We will move to comfort care tomorrow and allow other family member to come in.  DNR if he deteriorates.   Kipp Brood, MD [facility] ICU Physician [place]  Pager: [phone] Mobile: [phone] After hours: [phone].  02/21/2019, 3:14 PM

## 2019-02-21 NOTE — Progress Notes (Signed)
Responded to page from unit secretary to visit with grandson of Ruben Anderson who had just been placed on DNR.  By the time I arrived the grandson had left.Spoke with patient's nurse who reported family will be gather tomorrow to speak with doctor about moving Ruben Anderson to comfort care.  She did not know where the grandson was; thought he was going to stay until visiting hours over.  She informed me the grandson's phone number is in the chart.  Chaplain will reach out to grandson.    De Burrs Chaplain Resident

## 2019-02-22 ENCOUNTER — Inpatient Hospital Stay (HOSPITAL_COMMUNITY): Payer: Medicare HMO

## 2019-02-22 ENCOUNTER — Encounter (HOSPITAL_COMMUNITY): Payer: Self-pay | Admitting: Pulmonary Disease

## 2019-02-22 ENCOUNTER — Other Ambulatory Visit: Payer: Self-pay

## 2019-02-22 LAB — CULTURE, BAL-QUANTITATIVE W GRAM STAIN: Culture: 10000 — AB

## 2019-02-22 LAB — CBC WITH DIFFERENTIAL/PLATELET
Abs Immature Granulocytes: 0.04 10*3/uL (ref 0.00–0.07)
Basophils Absolute: 0 10*3/uL (ref 0.0–0.1)
Basophils Relative: 0 %
Eosinophils Absolute: 0 10*3/uL (ref 0.0–0.5)
Eosinophils Relative: 0 %
HCT: 32.5 % — ABNORMAL LOW (ref 39.0–52.0)
Hemoglobin: 9.8 g/dL — ABNORMAL LOW (ref 13.0–17.0)
Immature Granulocytes: 1 %
Lymphocytes Relative: 10 %
Lymphs Abs: 0.9 10*3/uL (ref 0.7–4.0)
MCH: 27.4 pg (ref 26.0–34.0)
MCHC: 30.2 g/dL (ref 30.0–36.0)
MCV: 90.8 fL (ref 80.0–100.0)
Monocytes Absolute: 0.9 10*3/uL (ref 0.1–1.0)
Monocytes Relative: 11 %
Neutro Abs: 6.6 10*3/uL (ref 1.7–7.7)
Neutrophils Relative %: 78 %
Platelets: 193 10*3/uL (ref 150–400)
RBC: 3.58 MIL/uL — ABNORMAL LOW (ref 4.22–5.81)
RDW: 14 % (ref 11.5–15.5)
WBC: 8.5 10*3/uL (ref 4.0–10.5)
nRBC: 0 % (ref 0.0–0.2)

## 2019-02-22 LAB — BASIC METABOLIC PANEL
Anion gap: 7 (ref 5–15)
BUN: 30 mg/dL — ABNORMAL HIGH (ref 8–23)
CO2: 29 mmol/L (ref 22–32)
Calcium: 8.1 mg/dL — ABNORMAL LOW (ref 8.9–10.3)
Chloride: 110 mmol/L (ref 98–111)
Creatinine, Ser: 0.59 mg/dL — ABNORMAL LOW (ref 0.61–1.24)
GFR calc Af Amer: >60 mL/min (ref >60)
GFR calc non Af Amer: >60 mL/min (ref >60)
Glucose, Bld: 124 mg/dL — ABNORMAL HIGH (ref 70–99)
Potassium: 3.7 mmol/L (ref 3.5–5.1)
Sodium: 146 mmol/L — ABNORMAL HIGH (ref 135–145)

## 2019-02-22 LAB — GLUCOSE, CAPILLARY
Glucose-Capillary: 100 mg/dL — ABNORMAL HIGH (ref 70–99)
Glucose-Capillary: 105 mg/dL — ABNORMAL HIGH (ref 70–99)
Glucose-Capillary: 113 mg/dL — ABNORMAL HIGH (ref 70–99)
Glucose-Capillary: 98 mg/dL (ref 70–99)

## 2019-02-22 LAB — PHOSPHORUS: Phosphorus: 2.9 mg/dL (ref 2.5–4.6)

## 2019-02-22 LAB — MAGNESIUM: Magnesium: 2 mg/dL (ref 1.7–2.4)

## 2019-02-22 MED ORDER — LACTATED RINGERS IV BOLUS
500.0000 mL | Freq: Once | INTRAVENOUS | Status: AC
  Administered 2019-02-22: 500 mL via INTRAVENOUS

## 2019-02-22 MED ORDER — GLYCOPYRROLATE 1 MG PO TABS
1.0000 mg | ORAL_TABLET | ORAL | Status: DC | PRN
  Filled 2019-02-22: qty 1

## 2019-02-22 MED ORDER — ACETAMINOPHEN 650 MG RE SUPP: 650.0000 mg | Freq: Four times a day (QID) | RECTAL | Status: DC | PRN

## 2019-02-22 MED ORDER — DIPHENHYDRAMINE HCL 50 MG/ML IJ SOLN: 25.0000 mg | INTRAMUSCULAR | Status: DC | PRN

## 2019-02-22 MED ORDER — GLYCOPYRROLATE 0.2 MG/ML IJ SOLN
0.2000 mg | INTRAMUSCULAR | Status: DC | PRN
  Administered 2019-02-22: 15:00:00 0.2 mg via INTRAVENOUS
  Filled 2019-02-22: qty 1

## 2019-02-22 MED ORDER — DEXTROSE 5 % IV SOLN: INTRAVENOUS | Status: DC

## 2019-02-22 MED ORDER — LORAZEPAM 2 MG/ML IJ SOLN
2.0000 mg | INTRAMUSCULAR | Status: DC | PRN
  Administered 2019-02-22: 15:00:00 2 mg via INTRAVENOUS
  Filled 2019-02-22: qty 1

## 2019-02-22 MED ORDER — MORPHINE 100MG IN NS 100ML (1MG/ML) PREMIX INFUSION
0.0000 mg/h | INTRAVENOUS | Status: DC
  Administered 2019-02-22: 15:00:00 5 mg/h via INTRAVENOUS
  Filled 2019-02-22: qty 100

## 2019-02-22 MED ORDER — MORPHINE SULFATE (PF) 2 MG/ML IV SOLN
2.0000 mg | INTRAVENOUS | Status: DC | PRN
  Filled 2019-02-22: qty 1

## 2019-02-22 MED ORDER — MORPHINE BOLUS VIA INFUSION
5.0000 mg | INTRAVENOUS | Status: DC | PRN
  Filled 2019-02-22: qty 5

## 2019-02-22 MED ORDER — POLYVINYL ALCOHOL 1.4 % OP SOLN
1.0000 [drp] | Freq: Four times a day (QID) | OPHTHALMIC | Status: DC | PRN
  Filled 2019-02-22: qty 15

## 2019-02-22 MED ORDER — ACETAMINOPHEN 325 MG PO TABS: 650.0000 mg | ORAL_TABLET | Freq: Four times a day (QID) | ORAL | Status: DC | PRN

## 2019-02-22 MED ORDER — GLYCOPYRROLATE 0.2 MG/ML IJ SOLN: 0.2000 mg | INTRAMUSCULAR | Status: DC | PRN

## 2019-02-23 ENCOUNTER — Encounter: Payer: Self-pay | Admitting: Internal Medicine

## 2019-02-24 LAB — CULTURE, BLOOD (ROUTINE X 2)
Culture: NO GROWTH
Culture: NO GROWTH
Special Requests: ADEQUATE
Special Requests: ADEQUATE

## 2019-02-25 ENCOUNTER — Telehealth: Payer: Self-pay

## 2019-02-25 NOTE — Telephone Encounter (Signed)
Received dc from [facility] (original)  DC is for cremation and a patient of Doctor Agarwala.  DC will be taken to Pulmonary Unit for signature.

## 2019-02-26 NOTE — Telephone Encounter (Signed)
Received original signed D/C, funeral home notified for pick up.  

## 2019-02-27 NOTE — Plan of Care (Signed)
  Problem: Education: Goal: Knowledge of General Education information will improve Description Including pain rating scale, medication(s)/side effects and non-pharmacologic comfort measures Outcome: Progressing   Problem: Nutrition: Goal: Adequate nutrition will be maintained Outcome: Progressing   Problem: Pain Managment: Goal: General experience of comfort will improve Outcome: Progressing   

## 2019-02-27 NOTE — Progress Notes (Signed)
eLink Physician-Brief Progress Note Patient Name: Ruben Anderson DOB: 02-11-1951 MRN: [id number]   Date of Service  2019/03/12  HPI/Events of Note  OLiguria  eICU Interventions  Re-scan Pt's bladder, 500 ml LR fluid bolus x 1        Delcia Spitzley U Merly Hinkson 03/12/2019, 1:49 AM

## 2019-02-27 NOTE — Progress Notes (Signed)
22mL IV Morphine wasted with Dario Guardian, RN in stericycle.

## 2019-02-27 NOTE — Procedures (Signed)
Extubation Procedure Note  Patient Details:   Name: Ruben Anderson DOB: 09-Sep-1950 MRN: [id number]   Airway Documentation:    Vent end date: 02/17/2019 Vent end time: 1610   Evaluation  O2 sats: stable throughout Complications: No apparent complications Patient did tolerate procedure well. Bilateral Breath Sounds: Rhonchi   No, pt could not speak post extubation.  Pt extubated to room air per physician's order and in accordance with the family's wishes.  Earney Navy 02/21/2019, 4:11 PM

## 2019-02-27 NOTE — Progress Notes (Signed)
Two RNs at bedside. No heart or lung sounds. Time of Death pronounced 1717.

## 2019-02-27 NOTE — Progress Notes (Signed)
Chaplain responded to EOL spiritual care consult. Wife Lorriane Shire and adult children, Gavino Fouch and Sharyn Lull are bedside.  Pt appears to be resting comfortably.  Family is in anticipatory grief. They requested prayer, which was provided. Family faith tradition is Psychologist, forensic.  Please contact for f/u support as needed.   Luana Shu [phone]    03/23/2019 1700  Clinical Encounter Type  Visited With Patient and family together  Visit Type Initial;Patient actively dying  Referral From  (spiritual care consult)  Consult/Referral To Chaplain  Spiritual Encounters  Spiritual Needs Prayer  Stress Factors  Family Stress Factors Major life changes

## 2019-02-27 NOTE — Progress Notes (Addendum)
Pt had no urine output through condom catheter. Bladder scan showed upwards of 400 mL in urine; this RN proceeded to I&O cath. I&O catheter met resistance while threading cath in and no urine output was collected. Bloody, purulent discharge was found on cath tip.   ELink called and RN awaiting MD call.   1002: MD returned call. No new orders at this time.   0600: Patient put out 450 mL of urine within an hour.

## 2019-02-27 NOTE — Progress Notes (Signed)
NAME:  Ruben Anderson, MRN:  [id number], DOB:  [birth year], LOS: 7 ADMISSION DATE:  2019-03-14, CONSULTATION DATE:  03-14-19 REFERRING MD: Ruben Anderson  CHIEF COMPLAINT:  Cardiac arrest   Brief History   70 year old male out of hospital cardiac arrest, unclear if asystole or PEA, CPR for approximately 10 minutes with return of spontaneous circulation prehospital, unresponsive.  Intubated, central line placed in ER for hypotension.  Ruben Anderson requested to admit.  History of present illness   69 year old male with only known history of probable COPD followed by Ruben Anderson, no PFTs on file, current smoker who presented to ER via EMS.  Reportedly the patient was in his "normal" state of health on 12/19, however family stated that the patient contacted them and they called EMS the patient's behalf today, unclear events of today leading up to that.  However per ER d/w family it was reported that patient was not feeling well for at least the past week and during a physician visit earlier this week he was reportedly hypoxic with saturation of 59% on room air.  He reportedly declined emergency evaluation at that time.  On EMS arrival the patient was unresponsive with asystole or PEA- unclear as to the shoulder rhythm.  He received CPR with return of spontaneous circulation after approximately 10 minutes and 2 rounds of 1 mg epinephrine.Ruben Anderson airway was placed.  He was unresponsive in route.  In the ER he was intubated, central line placed due to hypotension.  Hypercarbic with PCO2 of 87.  LFTs elevated.  Lactic acid 5.5.  UDS negative.  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  Incidental finding of soft tissue density on the left external auditory canal with erosion of the adjacent temporal bone.  Ruben Anderson requested to admit patient.  Past Medical History  No past medical history on file.  COPD only reported history.   Significant Hospital Events   12/20  Admitted   Consults:  Ruben Anderson   Procedures:  12/20 ETT>> 12/20 R femoral CVC>> R radial art line 12/20 >   Significant Diagnostic Tests:  12/20 CT Head  > neg for acute intracranial process. Soft tissue density in left external auditory canal with erosion of the adjacent left temporal bone.  Recommend EENT consultation. 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 EEG 12/21 > no definite seizure.  Technically difficult study suggestive of profound diffuse encephalopathic changes.  Micro Data:  SARS Coronavirus 2 negative Influenza A&B negative    Antimicrobials:  NA  Interim history/subjective:  After discussion with son on 02/21/2019 plan is for terminal wean in near future comfort care. Objective   Blood pressure 115/73, pulse 79, temperature 99.7 F (37.6 C), resp. rate (!) 21, height 5\' 9"  (1.753 m), weight 78.2 kg, SpO2 100 %.    Vent Mode: PRVC FiO2 (%):  [40 %] 40 % Set Rate:  [20 bmp] 20 bmp Vt Set:  [570 mL] 570 mL PEEP:  [10 cmH20] 10 cmH20 Plateau Pressure:  [19 cmH20-21 cmH20] 20 cmH20   Intake/Output Summary (Last 24 hours) at 01/29/2019 0908 Last data filed at 02/18/2019 0700 Gross per 24 hour  Intake 1634.8 ml  Output 835 ml  Net 799.8 ml   Filed Weights   02/21/19 0425 02/02/2019 0500  Weight: 77.2 kg 78.2 kg    Examination: General: Cachectic male who follows commands HEENT: Tracheal tube gastric tube in place Neuro:  Follows some commands weakly CV: Heart sounds regular PULM: Diminished throughout GI: soft, bsx4 active  Extremities: warm/dry,  edema  Skin: no rashes or lesions       Assessment & Plan:   PEA cardiac arrest likely primary respiratory arrest leading to cardiac arrest.  Right bundle branch block per EKG otherwise no sig findings. HTN Completed TTM  Profound weakness is reported neurological weakness in upper extremities x8 months prior to admission. Moot  point    Acute respiratory failure endotracheal intubation in the setting of severe underlying emphysema 02/19/2019 CT of the chest reveals right upper lobe right lower lobe consolidation this would preclude any attempted extubation. Underwent fiberoptic bronchoscopy 1225 noted to have thick tenacious secretions in the bronchus  02/21/2019 son and Dr. Denese Anderson at discussions and due to the fact his father has been in declining health for months and is has severe emphysema and cannot walk more than 2 to 3 feet without being short of breath and his father is expressed desire not to be on life support for prolonged periods therefore plan is for terminal extubation in the near future with goal of care being comfort and not curative.   ? 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 Moot point at this time  AKI  Lab Results  Component Value Date   CREATININE 0.59 (L) 02/16/2019   CREATININE 0.63 02/21/2019   CREATININE 0.50 (L) 02/20/2019   Recent Labs  Lab 02/20/19 0340 02/21/19 0524 01/30/2019 0241  K 3.4* 4.0 3.7    Dr. Creatinine replace electrolytes as needed  Elevated LFTs  Resolved   Lung lesion per EMR review, seen on previous CT. CT with contrast on 02/19/2019 small right upper lobe bronchus with small polypoid lesion noted as none seen on prior CT scan right upper lobe is now completely occluded by secretions.  Secretions altered in the right lower lobe Currently a moot point  Left external auditory canal soft tissue density with erosion of adjacent temporal bone. Possible ENT consult in the future  Best practice:  Diet: TF's.  Currently on hold for vomiting will start back to trickle Pain/Anxiety/Delirium protocol (if indicated): Propofol gtt / Fentanyl gtt / Midazolam PRN.  RASS goal -1. VAP protocol (if indicated): In place. DVT prophylaxis: Heparin subcu GI prophylaxis: PPI Glucose control: Sliding scale  insulin Mobility: Bedrest Code Status: Full Family Communication: 02/19/2019 spoke at length with son Ruben Anderson considering the fact his father is 61 weaning well from ventilator most likely secondary to his severe underlying emphysema.  Also explained in detail possible tracheostomy scenario to facilitate his, not the ventilator.  The son reports that his dad's been having upper extremity weakness for 8 to 9 months and has gone from 230 pounds down to 180 pounds prior to admission. Disposition: ICU     Brett Canales Elora Wolter ACNP Acute Care Nurse Practitioner [facility] Pulmonary/Critical Care Please consult Amion 01/28/2019, 9:08 AM

## 2019-02-27 DEATH — deceased

## 2019-03-30 NOTE — Death Summary Note (Signed)
DEATH SUMMARY   Patient Details  Name: Ruben Anderson MRN: [id number] DOB: [birth year]  Admission/Discharge Information   Admit Date:  02/16/2019  Date of Death: Date of Death: 02-23-19  Time of Death: Time of Death: 06-15-15  Length of Stay: 7  Referring Physician: Leilani Able, MD   Reason(s) for Hospitalization  Cardiac arrest secondary to respiratory failure  Diagnoses  Preliminary cause of death: Acute on chronic hypoxic respiratory failure Secondary Diagnoses (including complications and co-morbidities):  Active Problems:   Cardiac arrest (HCC)   Acute respiratory failure with hypoxia (HCC)   Acute on chronic combined systolic and diastolic congestive heart failure (HCC)   Hypomagnesemia   Altered mental status   Brief Hospital Course (including significant findings, care, treatment, and services provided and events leading to death)  Ruben Anderson is a 69 y.o. year old male who has advanced COPD and had lost over 20 pounds over the last 6 months. He had been increasingly short of breath and on the day of admission he was in significant respiratory distress prior to becoming unresponsive and was found to be in cardiac arrest by EMS. Spontaneous circulation was reestablished.  The patient was admitted from the ED to the ICU.  While he made a partial neurological recovery he was unable to wean from mechanical ventilation.  Goals of care were reviewed with the family.  It is likely that the patient's event was as a result of progression of his underlying lung disease.  They agreed that he had been in failing health and had expressed a desire not to have prolonged aggressive intervention.  A decision was then made to transition to comfort care.  Patient was extubated after sedation was initiated and the patient passed away peacefully.    Pertinent Labs and Studies  Significant Diagnostic Studies CT HEAD WO CONTRAST  Result Date: 2019/02/16 CLINICAL DATA:  Cardiac arrest.  EXAM: CT HEAD WITHOUT CONTRAST TECHNIQUE: Contiguous axial images were obtained from the base of the skull through the vertex without intravenous contrast. COMPARISON:  None. FINDINGS: Brain: No evidence of acute infarction, hemorrhage, hydrocephalus, extra-axial collection or mass lesion/mass effect. 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 Vascular: No hyperdense vessel or unexpected calcification. Skull: Normal. Negative for fracture or focal lesion. Sinuses/Orbits: No acute finding. Ethmoid air cell mucosal thickening. Soft tissue density within the left external auditory canal with erosion of the adjacent temporal bone (series 4, image 11) and bulging of the tympanic membrane. No definite extension into the middle ear. Partial opacification of the left mastoid air cells. Other: None. IMPRESSION: 1. No acute intracranial abnormality. 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 2. Soft tissue density within the left external auditory canal with erosion of the adjacent temporal bone. Differential considerations include cholesteatoma or squamous cell carcinoma. Recommend ENT consultation for direct visualization. Electronically Signed   By: Obie Dredge M.D.   On: 16-Feb-2019 11:47   CT CHEST W CONTRAST  Result Date: 02/19/2019 CLINICAL DATA:  Follow-up of pulmonary nodule. EXAM: CT CHEST WITH CONTRAST TECHNIQUE: Multidetector CT imaging of the chest was performed during intravenous contrast administration. CONTRAST:  69mL OMNIPAQUE IOHEXOL 300 MG/ML  SOLN COMPARISON:  Chest CTs dated 01/12/2019 and 08/05/2018 FINDINGS: Cardiovascular: Heart size is normal. No pulmonary emboli. No pericardial effusion. Slight aortic atherosclerosis. Mediastinum/Nodes: Endotracheal tube and NG tube appear in good position. Thyroid gland, trachea, and esophagus appear normal. No hilar or mediastinal adenopathy. Lungs/Pleura: There are extensive chronic emphysematous changes in  the upper lobes. The patient  has developed almost complete collapse of the right lower lobe with a small right pleural effusion. There is also slight infiltrate or atelectasis at the left lung base with a tiny left effusion. There is shift of the heart and other mediastinal structures to the right because of the right lower lobe collapse. There is also slight atelectasis in the right middle lobe. The right upper lobe bronchus with the small polypoid lesion noted on the prior CT scan of 01/12/2019 is completely occluded by secretions. Secretions also occlude the right lower lobe bronchus. Upper Abdomen: No acute abnormality. Musculoskeletal: No chest wall abnormality. No acute or significant osseous findings. The patient has very little body fat and has hazy diffuse anasarca. IMPRESSION: 1. No pulmonary emboli. Extensive emphysema primarily in the upper lobes. 2. Almost complete collapse of the right lower lobe with shift of the heart and other mediastinal structures to the right because of the right lower lobe collapse. 3. Secretions occlude the right lower lobe bronchus and at least partially occluded bronchi in the right middle and upper lobes. The small polypoid lesion noted in the right upper lobe bronchus on the prior study is obscured by the secretions. 4. Slight infiltrate or atelectasis at the left lung base with a tiny left effusion. 5. Aortic Atherosclerosis (ICD10-I70.0) and Emphysema (ICD10-J43.9). Electronically Signed   By: Francene Boyers M.D.   On: 02/19/2019 13:18   AM DG Chest Port 1 View  Result Date: 02/06/2019 CLINICAL DATA:  Respiratory failure EXAM: PORTABLE CHEST 1 VIEW COMPARISON:  February 21, 2019 6:34 a.m. FINDINGS: Endotracheal tube is identified distal tip 4.7 cm from carina. Nasogastric tube is identified distal tip in the proximal stomach. Small bilateral pleural effusions are identified. Mild patchy increased interstitial opacities are noted in bilateral lung bases unchanged. The heart size is normal.  IMPRESSION: Life supporting devices in good position. Small bilateral pleural effusions are identified. Mild patchy increased interstitial opacities in lung bases are unchanged. Electronically Signed   By: Sherian Rein M.D.   On: 02/03/2019 08:18   AM DG Chest Port 1 View  Result Date: 02/21/2019 CLINICAL DATA:  69 year old male status post bronchoscopy yesterday. Status post cardiac arrest on therapeutic temperature management. EXAM: PORTABLE CHEST 1 VIEW COMPARISON:  02/19/2019 and earlier. FINDINGS: Portable AP semi upright view at 0634 hours. Stable endotracheal tube tip in good position. Enteric tube is stable looped in the stomach. The patient remains mildly rotated to the right. Small right pleural effusion with no pulmonary edema or pneumothorax identified. Basilar predominant increased interstitial markings are stable and appear to be chronic to a large degree, present on 2019 radiographs. Stable cardiac size and mediastinal contours. Negative visible bowel gas pattern. IMPRESSION: 1.  Stable lines and tubes. 2. Continued stable ventilation. Small right pleural effusion and suspected lung base atelectasis and/or chronic interstitial scarring. Electronically Signed   By: Odessa Fleming M.D.   On: 02/21/2019 08:35   AM DG Chest Port 1 View  Result Date: 02/19/2019 CLINICAL DATA:  Respiratory failure. EXAM: PORTABLE CHEST 1 VIEW COMPARISON:  02/18/2019 FINDINGS: The endotracheal tube is 7.5 cm above the carina. The NG tube is coursing down the esophagus and into the stomach. Stable advanced emphysematous changes with pulmonary scarring and bibasilar compressive atelectasis. Possible small right effusion. IMPRESSION: 1. Stable support apparatus. 2. Underlying emphysematous changes, pulmonary scarring and compressive lower lobe atelectasis. Electronically Signed   By: Rudie Meyer M.D.   On: 02/19/2019 08:41  AM DG Chest Port 1 View  Result Date: 02/18/2019 CLINICAL DATA:  Respiratory failure, cardiac  arrest EXAM: PORTABLE CHEST 1 VIEW COMPARISON:  Feb 16, 2019 FINDINGS: Support devices are stable. Hyperinflation of the lungs. Bilateral lower lobe airspace opacities with possible layering effusions. Heart is normal size. IMPRESSION: Stable hyperinflation. Increasing bilateral lower lobe airspace opacities and possible layering effusions. Electronically Signed   By: Charlett Nose M.D.   On: 02/18/2019 08:36   DG Chest Port 1 View  Result Date: 2019-02-16 CLINICAL DATA:  Cardiac arrest. EXAM: PORTABLE CHEST 1 VIEW COMPARISON:  CT chest dated January 12, 2019. Chest x-ray dated December 22, 2018. FINDINGS: Endotracheal tube in place with the tip 6.7 cm above the carina. Enteric tube in the stomach. The heart size and mediastinal contours are within normal limits. Normal pulmonary vascularity. The lungs remain hyperinflated. Chronic scarring at the right lung base. No focal consolidation, pleural effusion, or pneumothorax. No acute osseous abnormality. IMPRESSION: 1. Appropriately positioned endotracheal and enteric tubes. 2. COPD without active disease. Electronically Signed   By: Obie Dredge M.D.   On: 02-16-2019 11:33   EEG adult  Result Date: 02/16/2019 Charlsie Quest, MD     02/16/2019  1:40 PM Patient Name: Alvester Eads MRN: [id number] Epilepsy Attending: Charlsie Quest Referring Physician/Provider: Karin Lieu, NP Date: 02/16/2019 Duration: 22.57 minutes Patient history: 69 year old male status post cardiac arrest on TTM.  Evaluate for seizures. Level of alertness: Comatose AEDs during EEG study: Propofol Technical aspects: This EEG study was done with scalp electrodes positioned according to the 10-20 International system of electrode placement. Electrical activity was acquired at a sampling rate of 500Hz  and reviewed with a high frequency filter of 70Hz  and a low frequency filter of 1Hz . EEG data were recorded continuously and digitally stored. Description: EEG was technically difficult due  to significant myogenic artifact.  Continuous generalized low voltage 2 to 3 Hz delta slowing was noted.  EEG was reactive to noxious stimulation. Hyperventilation and photic stimulation were not performed. Abnormalities -continuous slow, generalized IMPRESSION: This technically difficult study is suggestive of profound diffuse encephalopathy, nonspecific etiology. No definite seizures or epileptiform discharges were seen throughout the recording. Priyanka   Overnight EEG with video  Result Date: 02/17/2019 , MD     02/18/2019  8:46 AM Patient Name: Alby Schwabe MRN: Charlsie Quest Epilepsy Attending: 02/20/2019 Referring Physician/Provider: Susanne Greenhouse, NP Duration:  02/16/2019 1153 to 02/17/2019 1153  Patient history: 69 year old male status post cardiac arrest on TTM.  Evaluate for seizures.  Level of alertness: Comatose  AEDs during EEG study: Propofol  Technical aspects: This EEG study was done with scalp electrodes positioned according to the 10-20 International system of electrode placement. Electrical activity was acquired at a sampling rate of 500Hz  and reviewed with a high frequency filter of 70Hz  and a low frequency filter of 1Hz . EEG data were recorded continuously and digitally stored.  Description: EEG was technically difficult due to significant myogenic artifact.  Continuous generalized low voltage 2 to 3 Hz delta slowing was noted.  EEG was reactive to noxious stimulation. Hyperventilation and photic stimulation were not performed.  Abnormalities -Continuous slow, generalized  IMPRESSION: This technically difficult study is suggestive of profound diffuse encephalopathy, nonspecific etiology. No definite seizures or epileptiform discharges were seen throughout the recording. EEG appears similar to previous day.  02/18/2019   ECHOCARDIOGRAM COMPLETE  Result Date: 02/16/2019   ECHOCARDIOGRAM REPORT   Patient Name:   [facility]  [place] Date of Exam:  02/16/2019 Medical Rec #:  [id number]    Height:       69.0 in Accession #:    [id number]   Weight:       200.0 lb Date of Birth:  02-29-1950    BSA:          2.07 m Patient Age:    58 years     BP:           107/77 mmHg Patient Gender: M            HR:           55 bpm. Exam Location:  Inpatient Procedure: 2D Echo, Cardiac Doppler and Color Doppler Indications:    Cardiac arrest I46.9  History:        Patient has no prior history of Echocardiogram examinations.                 Arrythmias:Cardiac Arrest.  Sonographer:    Paulita Fujita RDCS Referring Phys: [id number] [place]  Sonographer Comments: Echo performed with patient supine and on artificial respirator. IMPRESSIONS  1. Left ventricular ejection fraction, by visual estimation, is 65 to 70%. The left ventricle has normal function. There is no left ventricular hypertrophy.  2. Left ventricular diastolic parameters are consistent with Grade II diastolic dysfunction (pseudonormalization).  3. The left ventricle has no regional wall motion abnormalities.  4. Global right ventricle has normal systolic function.The right ventricular size is normal. No increase in right ventricular wall thickness.  5. Left atrial size was normal.  6. Right atrial size was normal.  7. The mitral valve is normal in structure. Mild mitral valve regurgitation. No evidence of mitral stenosis.  8. The tricuspid valve is normal in structure. Tricuspid valve regurgitation is trivial.  9. The aortic valve is normal in structure. Aortic valve regurgitation is not visualized. No evidence of aortic valve sclerosis or stenosis. 10. The pulmonic valve was normal in structure. Pulmonic valve regurgitation is not visualized. 11. The inferior vena cava is normal in size with greater than 50% respiratory variability, suggesting right atrial pressure of 3 mmHg. 12. 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 FINDINGS  Left Ventricle: Left ventricular ejection  fraction, by visual estimation, is 65 to 70%. The left ventricle has normal function. The left ventricle has no regional wall motion abnormalities. There is no left ventricular hypertrophy. Left ventricular diastolic parameters are consistent with Grade II diastolic dysfunction (pseudonormalization). Normal left atrial pressure. Right Ventricle: The right ventricular size is normal. No increase in right ventricular wall thickness. Global RV systolic function is has normal systolic function. Left Atrium: Left atrial size was normal in size. Right Atrium: Right atrial size was normal in size Pericardium: There is no evidence of pericardial effusion. Mitral Valve: The mitral valve is normal in structure. Mild mitral valve regurgitation. No evidence of mitral valve stenosis by observation. Tricuspid Valve: The tricuspid valve is normal in structure. Tricuspid valve regurgitation is trivial. Aortic Valve: The aortic valve is normal in structure. Aortic valve regurgitation is not visualized. The aortic valve is structurally normal, with no evidence of sclerosis or stenosis. Pulmonic Valve: The pulmonic valve was normal in structure. Pulmonic valve regurgitation is not visualized. Pulmonic regurgitation is not visualized. Aorta: The aortic root, ascending aorta and aortic arch are all structurally normal, with no evidence of dilitation or obstruction. Venous: The inferior vena cava is normal in size with greater than 50% respiratory variability, suggesting right  atrial pressure of 3 mmHg. IAS/Shunts: No atrial level shunt detected by color flow Doppler. There is no evidence of a patent foramen ovale. No ventricular septal defect is seen or detected. There is no evidence of an atrial septal defect. Additional Comments: Hypercontractile, thick apex, consider apical variant hypertrophic cardiomyopathy.  LEFT VENTRICLE PLAX 2D LVIDd:         4.50 cm       Diastology LVIDs:         3.20 cm       LV e' lateral:   4.78 cm/s LV  PW:         1.10 cm       LV E/e' lateral: 12.1 LV IVS:        1.10 cm       LV e' medial:    4.54 cm/s LVOT diam:     1.90 cm       LV E/e' medial:  12.7 LV SV:         51 ml LV SV Index:   24.24 LVOT Area:     2.84 cm  LV Volumes (MOD) LV area d, A2C:    23.10 cm LV area d, A4C:    25.70 cm LV area s, A2C:    12.70 cm LV area s, A4C:    14.60 cm LV major d, A2C:   7.05 cm LV major d, A4C:   7.43 cm LV major s, A2C:   6.02 cm LV major s, A4C:   6.24 cm LV vol d, MOD A2C: 64.5 ml LV vol d, MOD A4C: 73.6 ml LV vol s, MOD A2C: 23.0 ml LV vol s, MOD A4C: 31.3 ml LV SV MOD A2C:     41.5 ml LV SV MOD A4C:     73.6 ml LV SV MOD BP:      43.6 ml RIGHT VENTRICLE RV S prime:     6.32 cm/s TAPSE (M-mode): 1.2 cm LEFT ATRIUM           Index       RIGHT ATRIUM          Index LA diam:      2.20 cm 1.07 cm/m  RA Area:     9.33 cm LA Vol (A2C): 31.0 ml 15.01 ml/m RA Volume:   19.00 ml 9.20 ml/m LA Vol (A4C): 28.0 ml 13.55 ml/m  AORTIC VALVE LVOT Vmax:   78.05 cm/s LVOT Vmean:  49.150 cm/s LVOT VTI:    0.130 m  AORTA Ao Root diam: 3.30 cm MITRAL VALVE MV Area (PHT): 2.51 cm             SHUNTS MV PHT:        87.72 msec           Systemic VTI:  0.13 m MV Decel Time: 303 msec             Systemic Diam: 1.90 cm MV E velocity: 57.60 cm/s 103 cm/s MV A velocity: 41.80 cm/s 70.3 cm/s MV E/A ratio:  1.38       1.5  Donato SchultzMark Skains MD Electronically signed by Donato SchultzMark Skains MD Signature Date/Time: 02/16/2019/10:47:15 AM    Final     Microbiology No results found for this or any previous visit (from the past 240 hour(s)).  Lab Basic Metabolic Panel: No results for input(s): NA, K, CL, CO2, GLUCOSE, BUN, CREATININE, CALCIUM, MG, PHOS in the last 168 hours. Liver Function Tests: No results for input(s): AST,  ALT, ALKPHOS, BILITOT, PROT, ALBUMIN in the last 168 hours. No results for input(s): LIPASE, AMYLASE in the last 168 hours. No results for input(s): AMMONIA in the last 168 hours. CBC: No results for input(s): WBC,  NEUTROABS, HGB, HCT, MCV, PLT in the last 168 hours. Cardiac Enzymes: No results for input(s): CKTOTAL, CKMB, CKMBINDEX, TROPONINI in the last 168 hours. Sepsis Labs: No results for input(s): PROCALCITON, WBC, LATICACIDVEN in the last 168 hours.  Procedures/Operations  Mechanical ventilation.   Omare Bilotta 03/04/2019, 1:47 PM

## 2019-07-21 ENCOUNTER — Ambulatory Visit: Payer: Medicare HMO | Admitting: Internal Medicine

## 2020-12-31 IMAGING — CT CT CHEST W/ CM
2 of 3 series · 15 of 36 positions shown, 18 images · IV contrast (omnipaque)
Comparison: Chest CTs dated 01/12/2019 and 08/05/2018

CLINICAL DATA: Follow-up of pulmonary nodule.

EXAM:
CT CHEST WITH CONTRAST
TECHNIQUE: Multidetector CT imaging of the chest was performed during
intravenous contrast administration.
CONTRAST:  75mL OMNIPAQUE IOHEXOL 300 MG/ML  SOLN

[Series 3: chest with 2mm st · axial · 0.82mm/px · z∈[+1050,+1322]mm · 12 of 160 slices shown, 15 images]
[im 12/160  mediastinal]
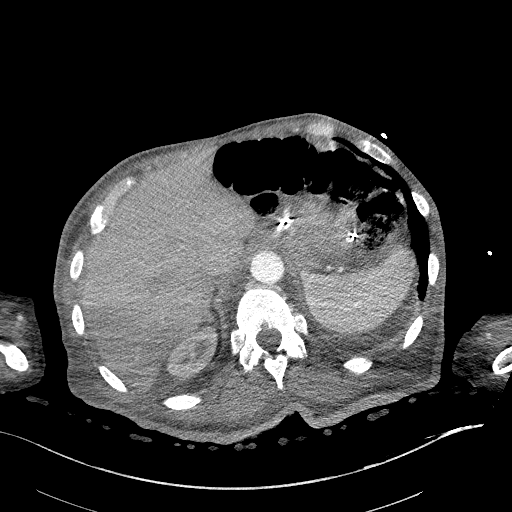
[im 12/160  lung]
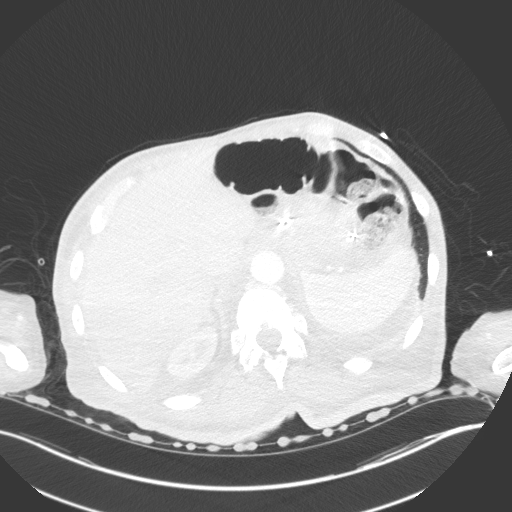
[im 24/160  lung]
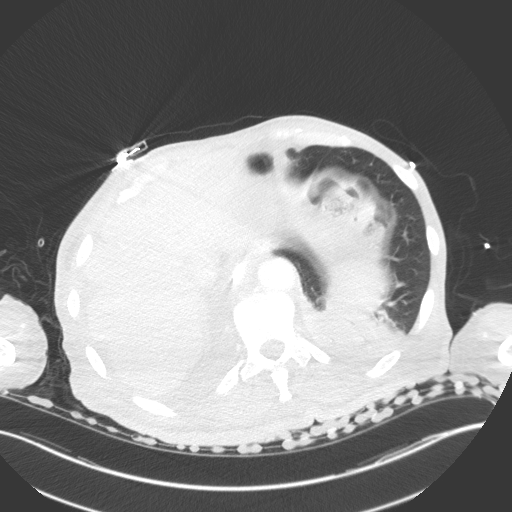
[im 36/160  lung]
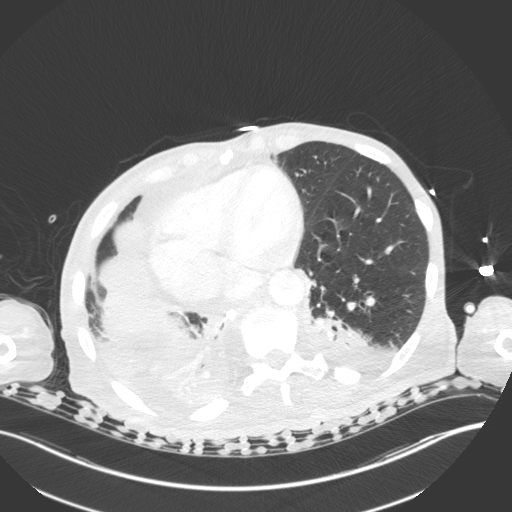
[im 48/160  lung]
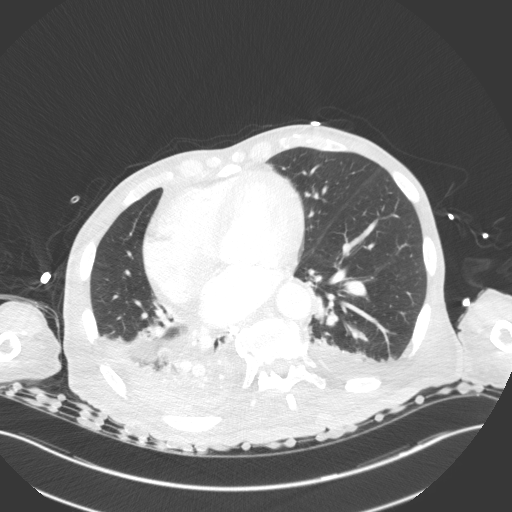
[im 59/160  mediastinal]
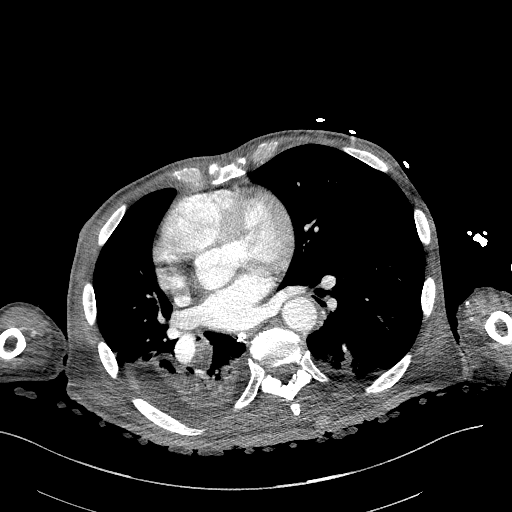
[im 59/160  lung]
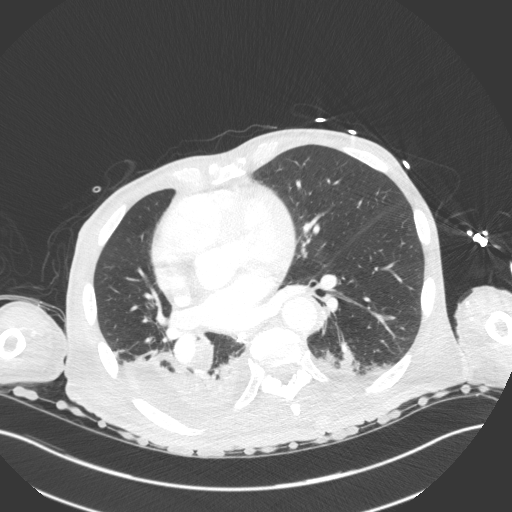
[im 71/160  lung]
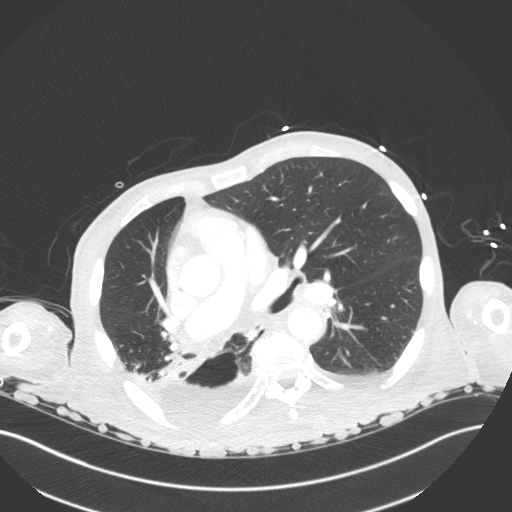
[im 89/160  lung]
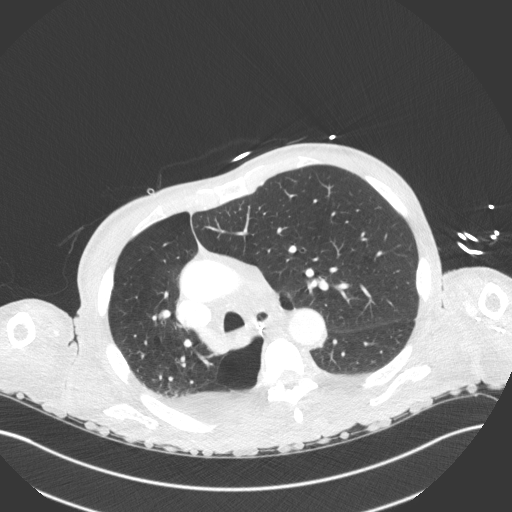
[im 101/160  lung]
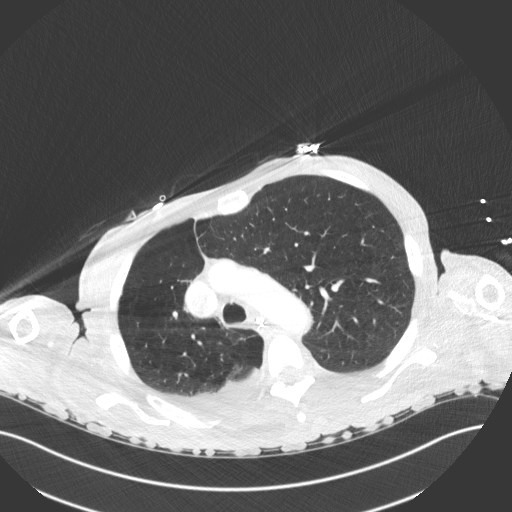
[im 112/160  mediastinal]
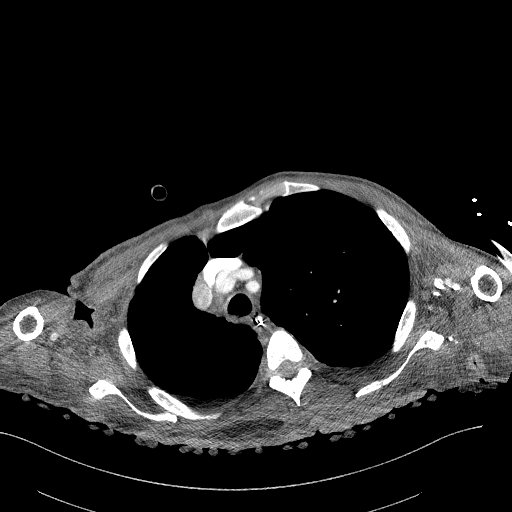
[im 112/160  lung]
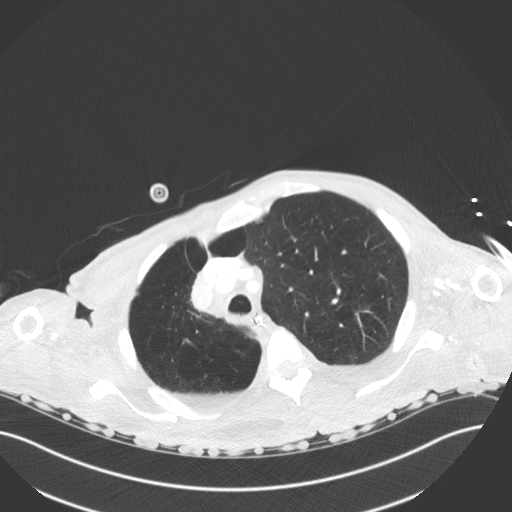
[im 124/160  lung]
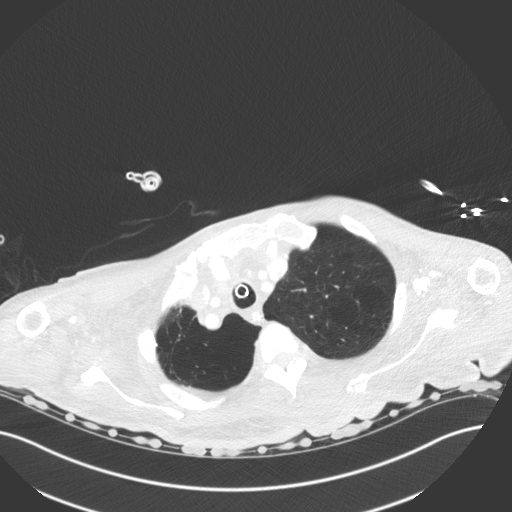
[im 136/160  lung]
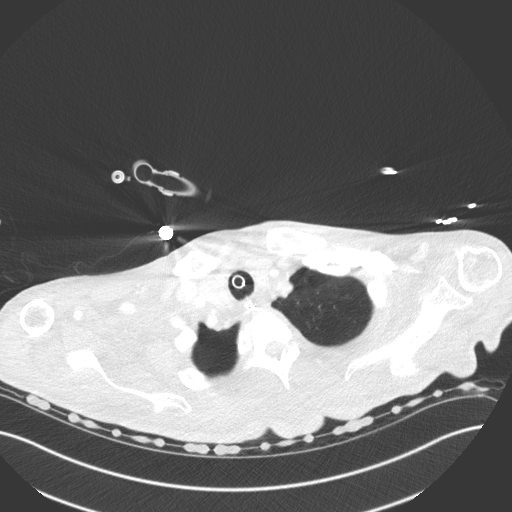
[im 148/160  lung]
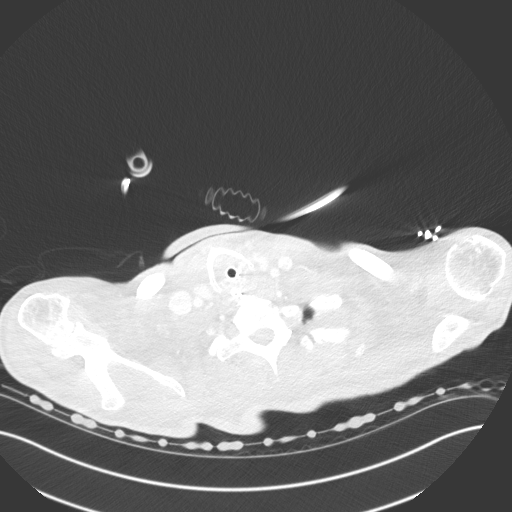

[Series 5: chest with 3mm st cor · coronal · 0.62mm/px · 3 of 101 slices shown]
[im 21/101  lung]
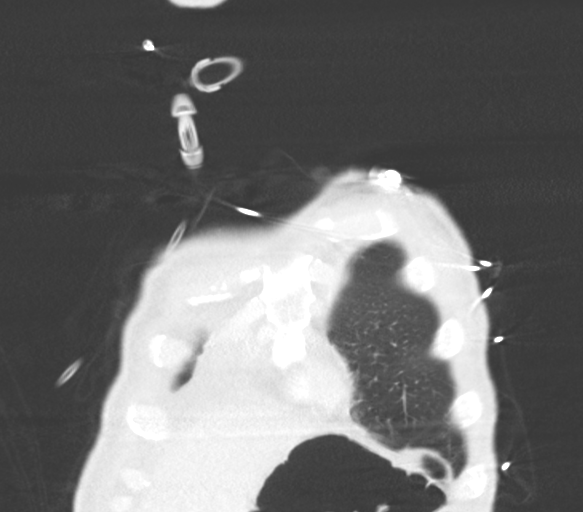
[im 41/101  lung]
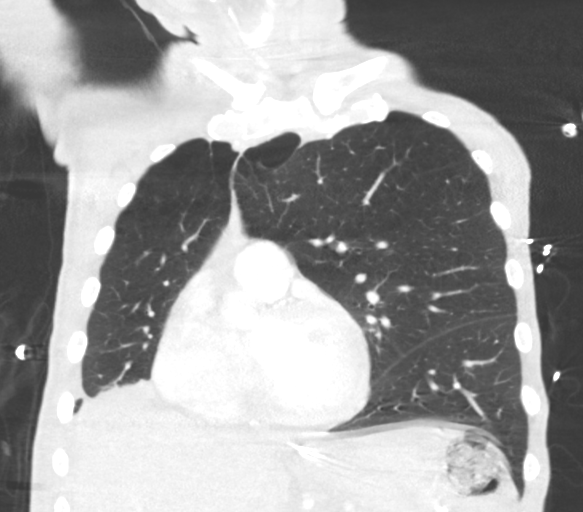
[im 61/101  lung]
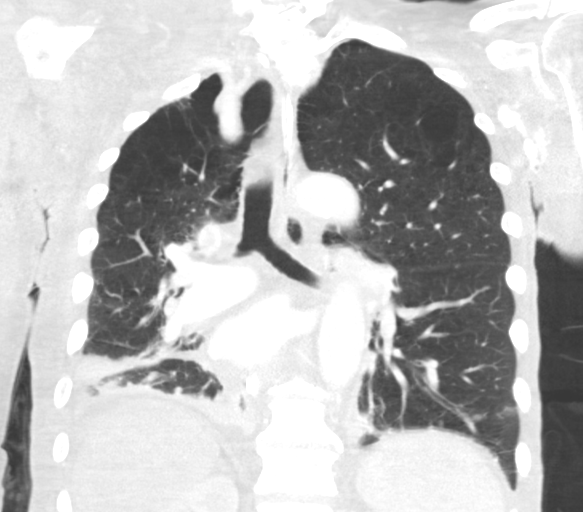

[15 of 36 positions shown; findings below may reference images not displayed]

FINDINGS: Cardiovascular: Heart size is normal. No pulmonary emboli. No
pericardial effusion. Slight aortic atherosclerosis.

Mediastinum/Nodes: Endotracheal tube and NG tube appear in good
position. Thyroid gland, trachea, and esophagus appear normal. No
hilar or mediastinal adenopathy.

Lungs/Pleura: There are extensive chronic emphysematous changes in
the upper lobes. The patient has developed almost complete collapse
of the right lower lobe with a small right pleural effusion. There
is also slight infiltrate or atelectasis at the left lung base with
a tiny left effusion. There is shift of the heart and other
mediastinal structures to the right because of the right lower lobe
collapse. There is also slight atelectasis in the right middle lobe.

The right upper lobe bronchus with the small polypoid lesion noted
on the prior CT scan of 01/12/2019 is completely occluded by
secretions. Secretions also occlude the right lower lobe bronchus.

Upper Abdomen: No acute abnormality.

Musculoskeletal: No chest wall abnormality. No acute or significant
osseous findings. The patient has very little body fat and has hazy
diffuse anasarca.
IMPRESSION: 1. No pulmonary emboli. Extensive emphysema primarily in the upper
lobes.
2. Almost complete collapse of the right lower lobe with shift of
the heart and other mediastinal structures to the right because of
the right lower lobe collapse.
3. Secretions occlude the right lower lobe bronchus and at least
partially occluded bronchi in the right middle and upper lobes. The
small polypoid lesion noted in the right upper lobe bronchus on the
prior study is obscured by the secretions.
4. Slight infiltrate or atelectasis at the left lung base with a
tiny left effusion.
5. Aortic Atherosclerosis (9HBEA-P7V.V) and Emphysema (9HBEA-RE8.O).
